# Patient Record
Sex: Female | Born: 1945 | Race: White | Hispanic: No | Marital: Single | Smoking: Never smoker
Health system: Southern US, Community
[De-identification: ages and names within clinical notes are randomized; demographics above are authoritative.]

## PROBLEM LIST (undated history)

## (undated) DIAGNOSIS — G43909 Migraine, unspecified, not intractable, without status migrainosus: Secondary | ICD-10-CM

## (undated) DIAGNOSIS — M5020 Other cervical disc displacement, unspecified cervical region: Secondary | ICD-10-CM

## (undated) DIAGNOSIS — I10 Essential (primary) hypertension: Secondary | ICD-10-CM

## (undated) DIAGNOSIS — I341 Nonrheumatic mitral (valve) prolapse: Secondary | ICD-10-CM

## (undated) HISTORY — PX: APPENDECTOMY: SHX54

## (undated) HISTORY — PX: HAND SURGERY: SHX662

## (undated) HISTORY — DX: Migraine, unspecified, not intractable, without status migrainosus: G43.909

## (undated) HISTORY — DX: Essential (primary) hypertension: I10

## (undated) HISTORY — DX: Nonrheumatic mitral (valve) prolapse: I34.1

## (undated) HISTORY — DX: Other cervical disc displacement, unspecified cervical region: M50.20

---

## 2010-08-15 ENCOUNTER — Ambulatory Visit: Payer: Self-pay | Admitting: Obstetrics and Gynecology

## 2010-09-15 ENCOUNTER — Emergency Department: Payer: Self-pay | Admitting: Emergency Medicine

## 2010-09-16 ENCOUNTER — Emergency Department: Payer: Self-pay | Admitting: Emergency Medicine

## 2011-02-03 ENCOUNTER — Ambulatory Visit: Payer: Self-pay | Admitting: Orthopedic Surgery

## 2011-02-17 ENCOUNTER — Emergency Department: Payer: Self-pay | Admitting: Emergency Medicine

## 2011-02-27 ENCOUNTER — Emergency Department: Payer: Self-pay | Admitting: Emergency Medicine

## 2011-03-10 ENCOUNTER — Emergency Department: Payer: Self-pay | Admitting: Emergency Medicine

## 2011-05-19 ENCOUNTER — Encounter: Payer: Self-pay | Admitting: Orthopedic Surgery

## 2011-06-19 ENCOUNTER — Encounter: Payer: Self-pay | Admitting: Orthopedic Surgery

## 2011-07-20 ENCOUNTER — Encounter: Payer: Self-pay | Admitting: Orthopedic Surgery

## 2011-08-17 ENCOUNTER — Encounter: Payer: Self-pay | Admitting: Orthopedic Surgery

## 2011-09-18 ENCOUNTER — Encounter: Payer: Self-pay | Admitting: Orthopedic Surgery

## 2011-09-20 ENCOUNTER — Ambulatory Visit: Payer: Self-pay | Admitting: Obstetrics and Gynecology

## 2011-09-20 LAB — COMPREHENSIVE METABOLIC PANEL
Albumin: 4.3 g/dL (ref 3.4–5.0)
Alkaline Phosphatase: 57 U/L (ref 50–136)
BUN: 24 mg/dL — ABNORMAL HIGH (ref 7–18)
Chloride: 105 mmol/L (ref 98–107)
Co2: 28 mmol/L (ref 21–32)
Creatinine: 0.8 mg/dL (ref 0.60–1.30)
EGFR (African American): >60
EGFR (Non-African Amer.): >60
Glucose: 87 mg/dL (ref 65–99)
SGPT (ALT): 33 U/L
Sodium: 140 mmol/L (ref 136–145)
Total Protein: 8.2 g/dL (ref 6.4–8.2)

## 2011-09-20 LAB — LIPID PANEL
Cholesterol: 254 mg/dL — ABNORMAL HIGH (ref 0–200)
Ldl Cholesterol, Calc: 171 mg/dL — ABNORMAL HIGH (ref 0–100)
VLDL Cholesterol, Calc: 21 mg/dL (ref 5–40)

## 2011-10-17 ENCOUNTER — Encounter: Payer: Self-pay | Admitting: Orthopedic Surgery

## 2011-12-19 ENCOUNTER — Emergency Department: Payer: Self-pay | Admitting: Emergency Medicine

## 2011-12-19 LAB — COMPREHENSIVE METABOLIC PANEL
Albumin: 2.9 g/dL — ABNORMAL LOW (ref 3.4–5.0)
Alkaline Phosphatase: 494 U/L — ABNORMAL HIGH (ref 50–136)
BUN: 33 mg/dL — ABNORMAL HIGH (ref 7–18)
Bilirubin,Total: 5.2 mg/dL — ABNORMAL HIGH (ref 0.2–1.0)
Chloride: 103 mmol/L (ref 98–107)
Creatinine: 1.92 mg/dL — ABNORMAL HIGH (ref 0.60–1.30)
EGFR (African American): 30 — ABNORMAL LOW
EGFR (Non-African Amer.): 26 — ABNORMAL LOW
Osmolality: 278 (ref 275–301)
SGOT(AST): 323 U/L — ABNORMAL HIGH (ref 15–37)
SGPT (ALT): 206 U/L — ABNORMAL HIGH
Sodium: 135 mmol/L — ABNORMAL LOW (ref 136–145)
Total Protein: 7.2 g/dL (ref 6.4–8.2)

## 2011-12-19 LAB — URINALYSIS, COMPLETE
Glucose,UR: NEGATIVE mg/dL (ref 0–75)
Granular Cast: 23
Ketone: NEGATIVE
Protein: 30
RBC,UR: 1 /HPF (ref 0–5)
Specific Gravity: 1.016 (ref 1.003–1.030)
WBC UR: 8 /HPF (ref 0–5)

## 2011-12-19 LAB — CBC
HCT: 35 % (ref 35.0–47.0)
HGB: 11.8 g/dL — ABNORMAL LOW (ref 12.0–16.0)
MCHC: 33.6 g/dL (ref 32.0–36.0)
MCV: 88 fL (ref 80–100)
RBC: 3.99 10*6/uL (ref 3.80–5.20)
WBC: 9.3 10*3/uL (ref 3.6–11.0)

## 2011-12-19 LAB — CK TOTAL AND CKMB (NOT AT ARMC): CK-MB: 11.2 ng/mL — ABNORMAL HIGH (ref 0.5–3.6)

## 2011-12-19 LAB — TROPONIN I: Troponin-I: <0.02 ng/mL

## 2011-12-23 ENCOUNTER — Inpatient Hospital Stay: Payer: Self-pay | Admitting: Internal Medicine

## 2011-12-23 LAB — COMPREHENSIVE METABOLIC PANEL
Albumin: 2.7 g/dL — ABNORMAL LOW (ref 3.4–5.0)
Alkaline Phosphatase: 628 U/L — ABNORMAL HIGH (ref 50–136)
Anion Gap: 8 (ref 7–16)
BUN: 46 mg/dL — ABNORMAL HIGH (ref 7–18)
Creatinine: 1.85 mg/dL — ABNORMAL HIGH (ref 0.60–1.30)
EGFR (African American): 32 — ABNORMAL LOW
EGFR (Non-African Amer.): 27 — ABNORMAL LOW
Glucose: 95 mg/dL (ref 65–99)
SGOT(AST): 143 U/L — ABNORMAL HIGH (ref 15–37)
SGPT (ALT): 159 U/L — ABNORMAL HIGH
Sodium: 135 mmol/L — ABNORMAL LOW (ref 136–145)
Total Protein: 7.5 g/dL (ref 6.4–8.2)

## 2011-12-23 LAB — CBC
MCHC: 33.9 g/dL (ref 32.0–36.0)
Platelet: 233 10*3/uL (ref 150–440)
RBC: 3.93 10*6/uL (ref 3.80–5.20)
RDW: 15.8 % — ABNORMAL HIGH (ref 11.5–14.5)

## 2011-12-23 LAB — URINALYSIS, COMPLETE
Bacteria: NONE SEEN
Bilirubin,UR: NEGATIVE
Leukocyte Esterase: NEGATIVE
Nitrite: NEGATIVE
Ph: 5 (ref 4.5–8.0)
RBC,UR: <1 /HPF (ref 0–5)
WBC UR: 3 /HPF (ref 0–5)

## 2011-12-23 LAB — PROTIME-INR
INR: 1.1
Prothrombin Time: 14.2 secs (ref 11.5–14.7)

## 2011-12-23 LAB — TROPONIN I: Troponin-I: <0.02 ng/mL

## 2011-12-24 LAB — CBC WITH DIFFERENTIAL/PLATELET
Basophil #: 0.1 10*3/uL (ref 0.0–0.1)
Basophil %: 0.5 %
Eosinophil %: 0.9 %
HGB: 10.3 g/dL — ABNORMAL LOW (ref 12.0–16.0)
Lymphocyte #: 2.8 10*3/uL (ref 1.0–3.6)
Lymphocyte %: 17.8 %
MCH: 28.1 pg (ref 26.0–34.0)
MCV: 84 fL (ref 80–100)
Monocyte #: 2 x10 3/mm — ABNORMAL HIGH (ref 0.2–0.9)
Monocyte %: 12.7 %
Neutrophil %: 68.1 %
Platelet: 282 10*3/uL (ref 150–440)
RBC: 3.67 10*6/uL — ABNORMAL LOW (ref 3.80–5.20)
RDW: 16.2 % — ABNORMAL HIGH (ref 11.5–14.5)
WBC: 15.7 10*3/uL — ABNORMAL HIGH (ref 3.6–11.0)

## 2011-12-24 LAB — COMPREHENSIVE METABOLIC PANEL
Anion Gap: 8 (ref 7–16)
BUN: 30 mg/dL — ABNORMAL HIGH (ref 7–18)
Bilirubin,Total: 1.6 mg/dL — ABNORMAL HIGH (ref 0.2–1.0)
Calcium, Total: 7.7 mg/dL — ABNORMAL LOW (ref 8.5–10.1)
Chloride: 108 mmol/L — ABNORMAL HIGH (ref 98–107)
EGFR (African American): 43 — ABNORMAL LOW
Potassium: 4.6 mmol/L (ref 3.5–5.1)
SGOT(AST): 111 U/L — ABNORMAL HIGH (ref 15–37)
Total Protein: 7.2 g/dL (ref 6.4–8.2)

## 2011-12-24 LAB — ACETAMINOPHEN LEVEL: Acetaminophen: <2 ug/mL

## 2011-12-24 LAB — LIPASE, BLOOD: Lipase: 2752 U/L — ABNORMAL HIGH (ref 73–393)

## 2013-04-29 ENCOUNTER — Encounter: Payer: Self-pay | Admitting: Orthopaedic Surgery

## 2013-05-18 ENCOUNTER — Encounter: Payer: Self-pay | Admitting: Orthopaedic Surgery

## 2013-06-18 ENCOUNTER — Encounter: Payer: Self-pay | Admitting: Orthopaedic Surgery

## 2013-07-19 ENCOUNTER — Encounter: Payer: Self-pay | Admitting: Orthopaedic Surgery

## 2013-07-29 IMAGING — CR DG CHEST 2V
1 series · 2 of 2 positions shown · non-contrast
Comparison: none

REASON FOR EXAM: cough
COMMENTS:

PROCEDURE:     DXR - DXR CHEST PA (OR AP) AND LATERAL  - December 23, 2011  [DATE]
RESULT:     Comparison: None

[Series 2: x chest ap · 0.14mm/px · 2 of 2 slices shown]
[im 1/2]
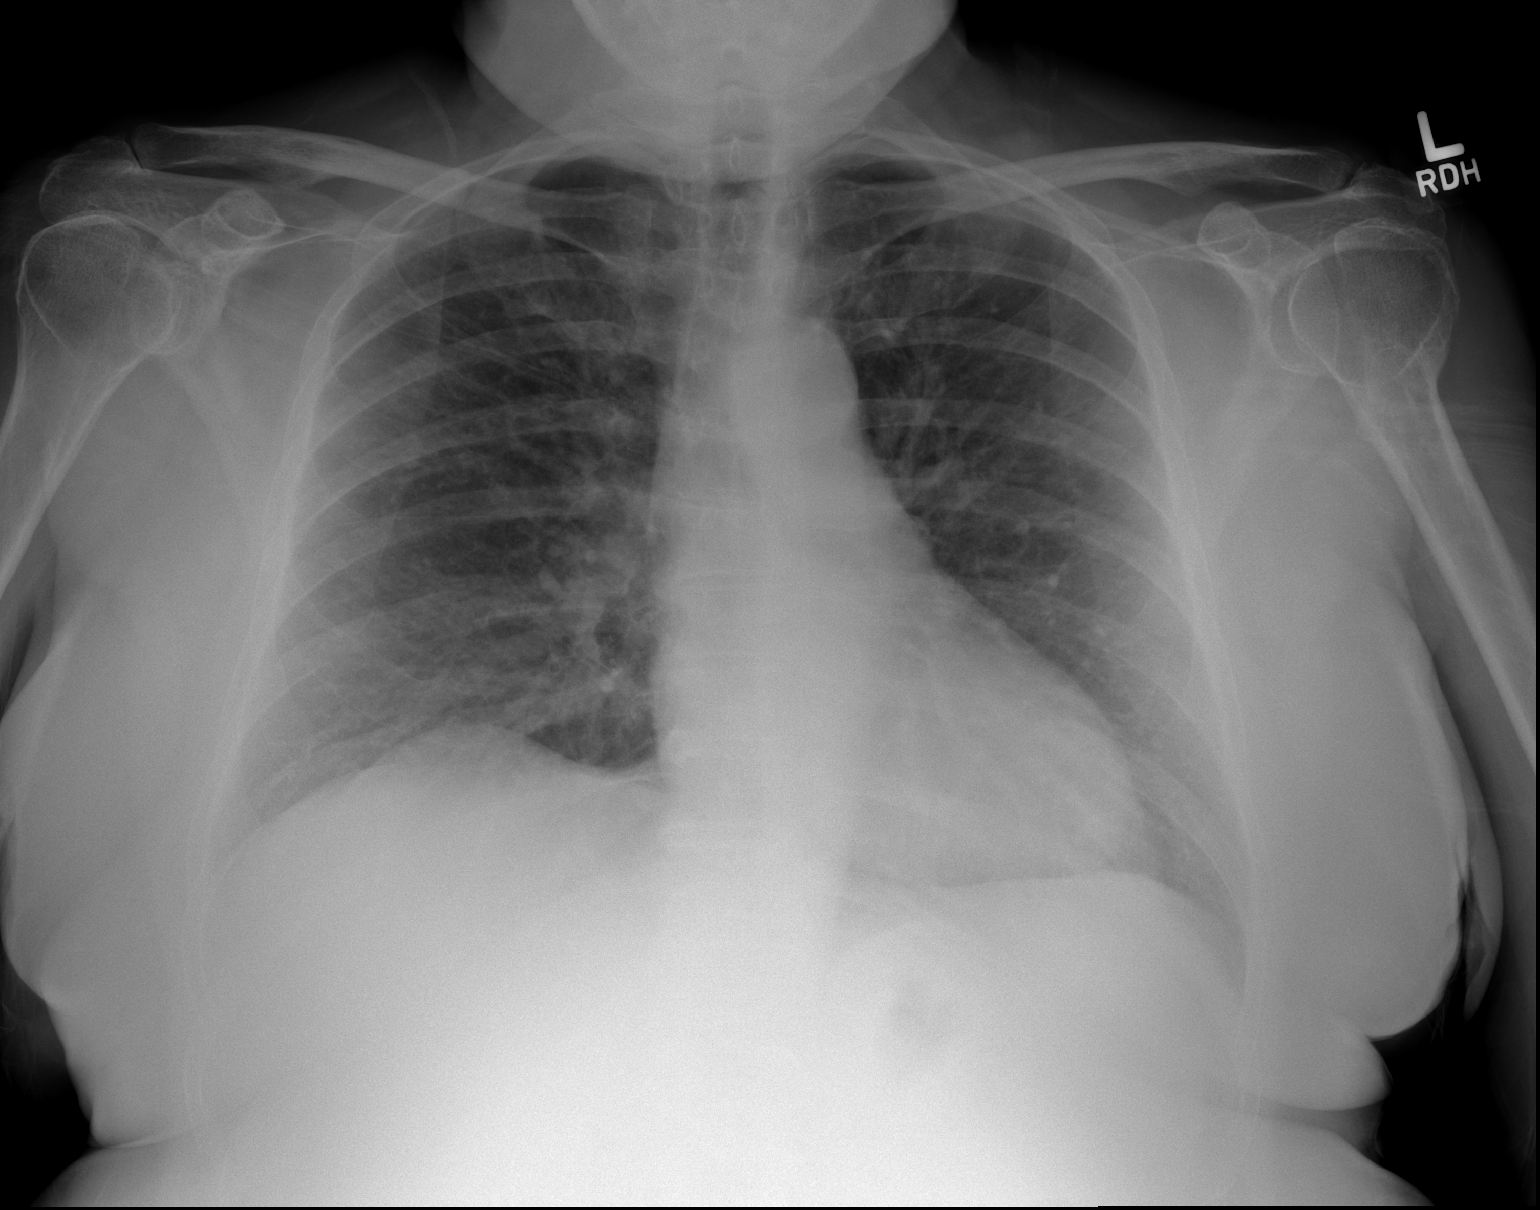
[im 2/2]
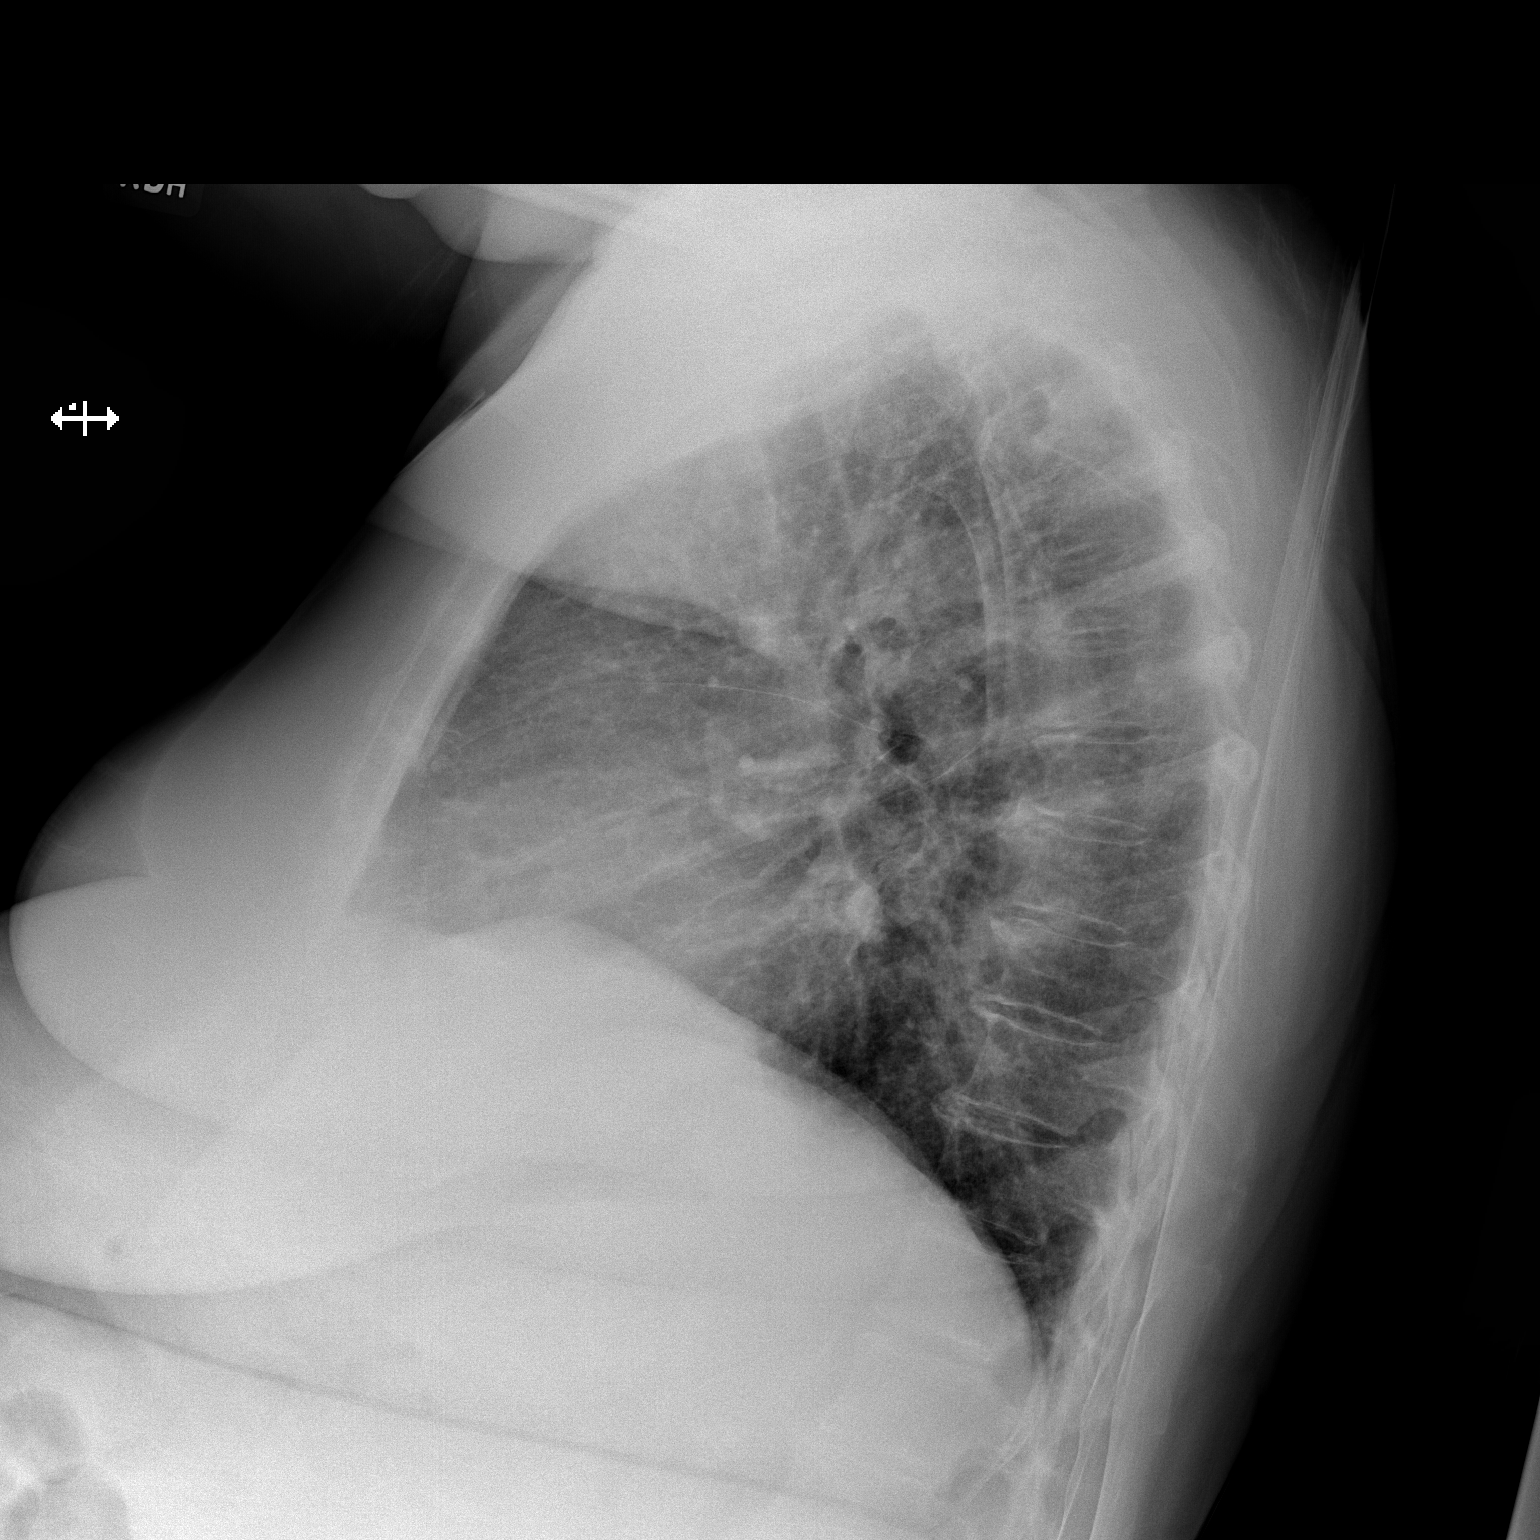

[2 of 2 positions shown; findings below may reference images not displayed]

FINDINGS: PA and lateral chest radiographs are provided.  There is no focal
parenchymal opacity, pleural effusion, or pneumothorax. The heart and
mediastinum are unremarkable.  The osseous structures are unremarkable.
IMPRESSION: No acute disease of the che[REDACTED]

## 2013-07-29 IMAGING — US ABDOMEN ULTRASOUND
1 series · 14 of 25 positions shown · non-contrast
Comparison: none

REASON FOR EXAM: lft elevation
COMMENTS:   May transport without cardiac monitor

PROCEDURE:     US  - US ABDOMEN GENERAL SURVEY  - December 23, 2011  [DATE]
RESULT:     Comparison: None
TECHNIQUE: Multiple gray-scale and color-flow Doppler images of the abdomen
are presented for review.

[Series 1: abdomen ultrasound · 0.31mm/px · 14 of 88 slices shown]
[im 1/88]
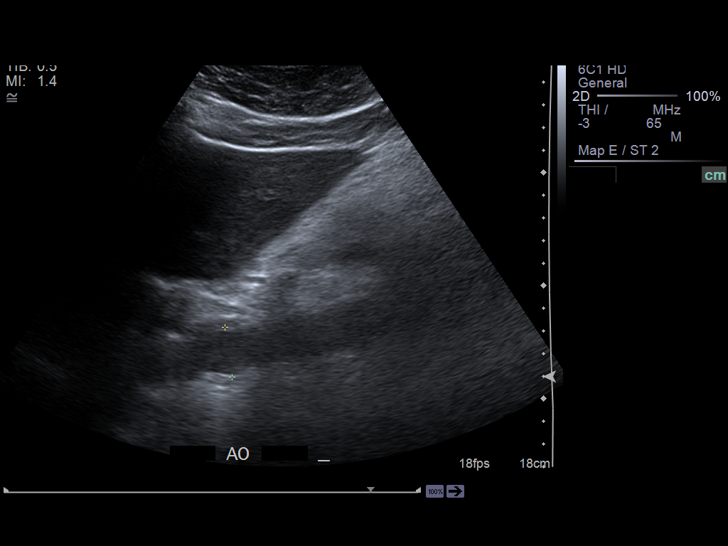
[im 8/88]
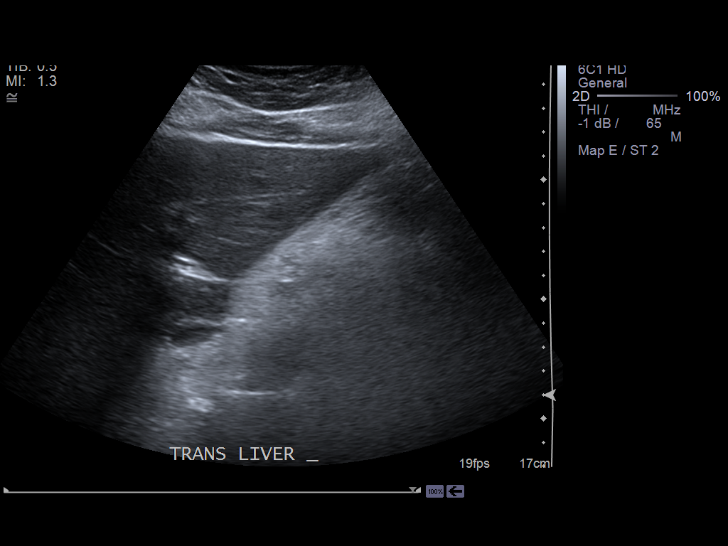
[im 15/88]
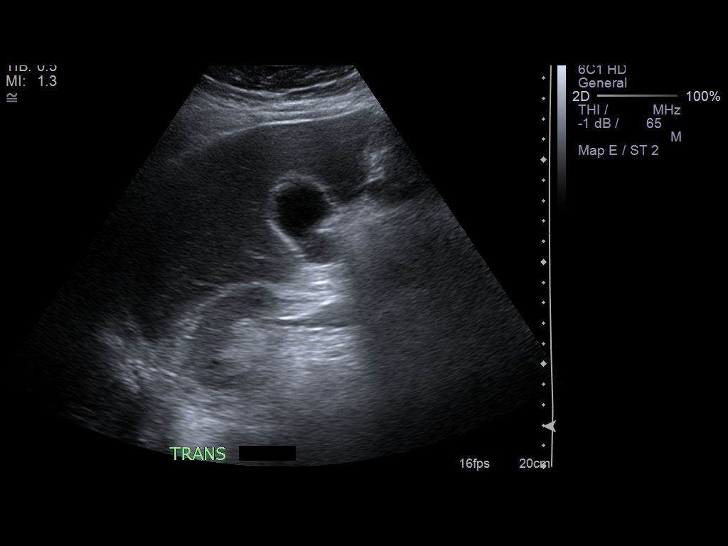
[im 22/88]
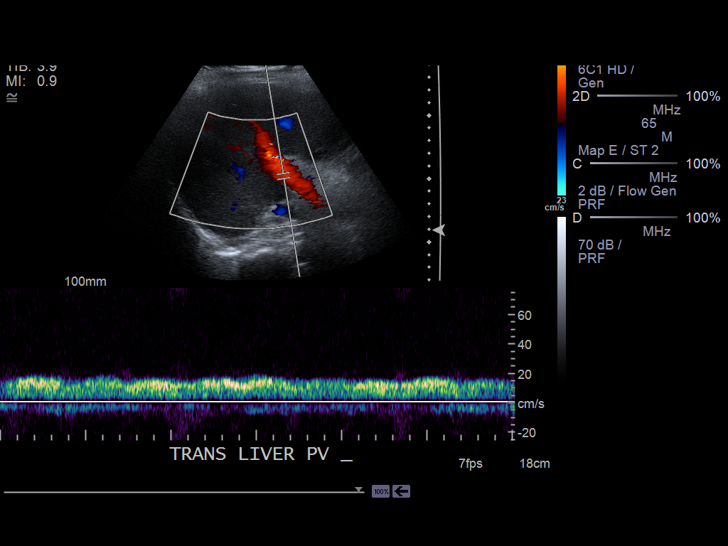
[im 30/88]
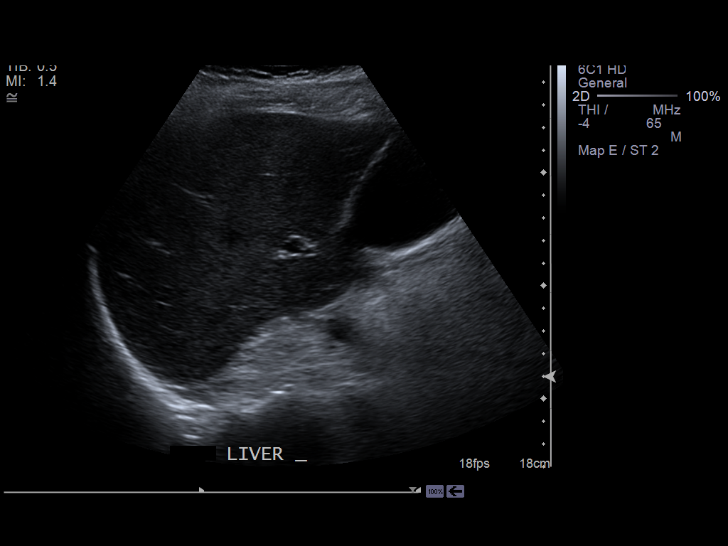
[im 33/88]
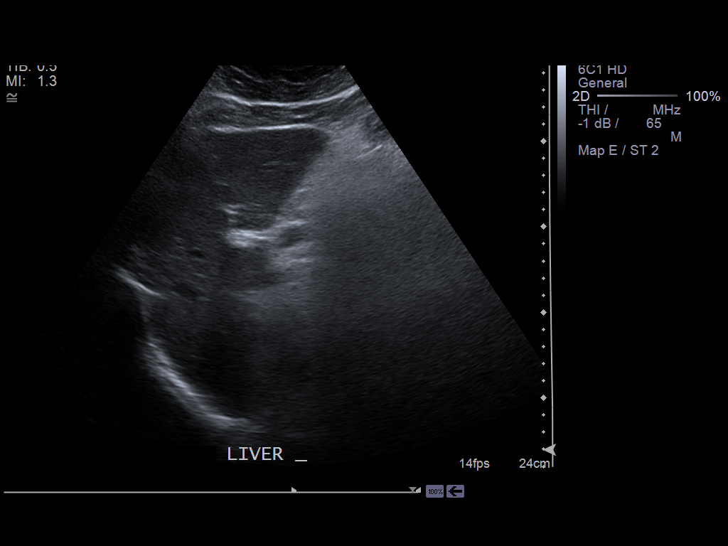
[im 40/88]
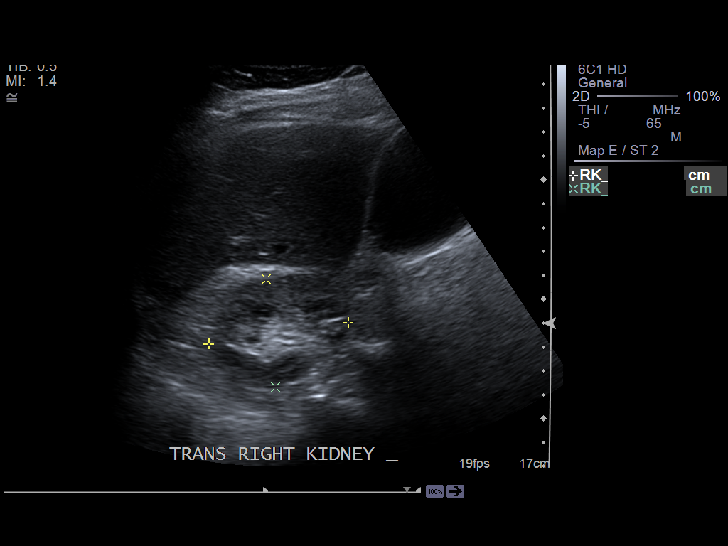
[im 48/88]
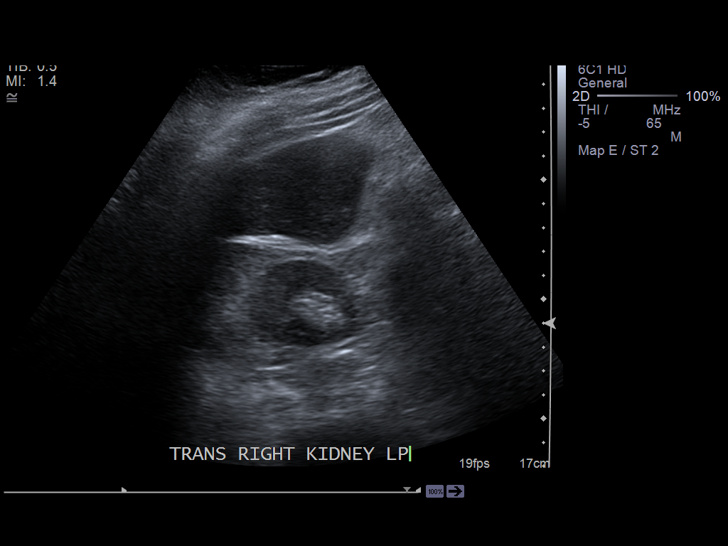
[im 55/88]
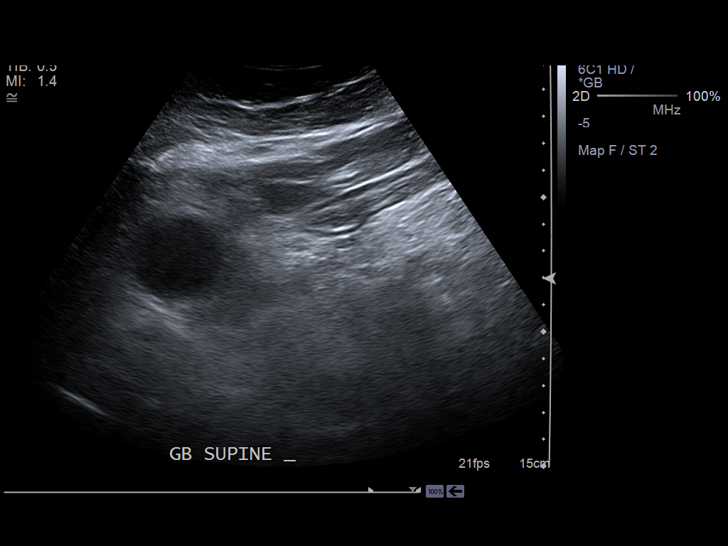
[im 59/88]
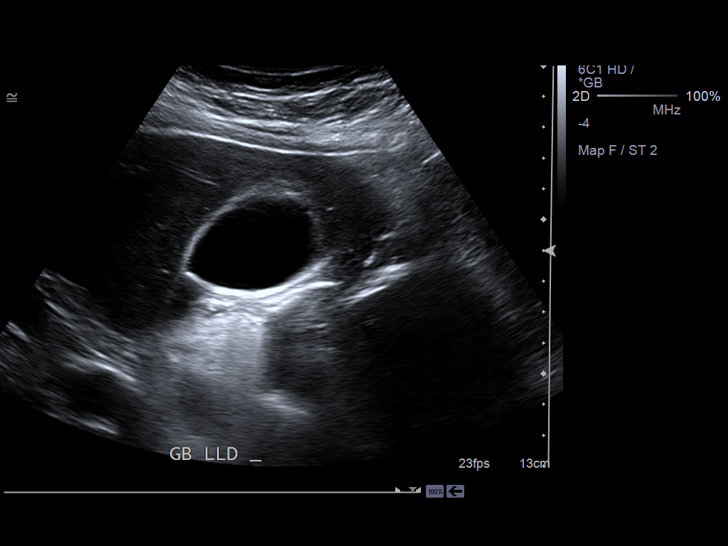
[im 66/88]
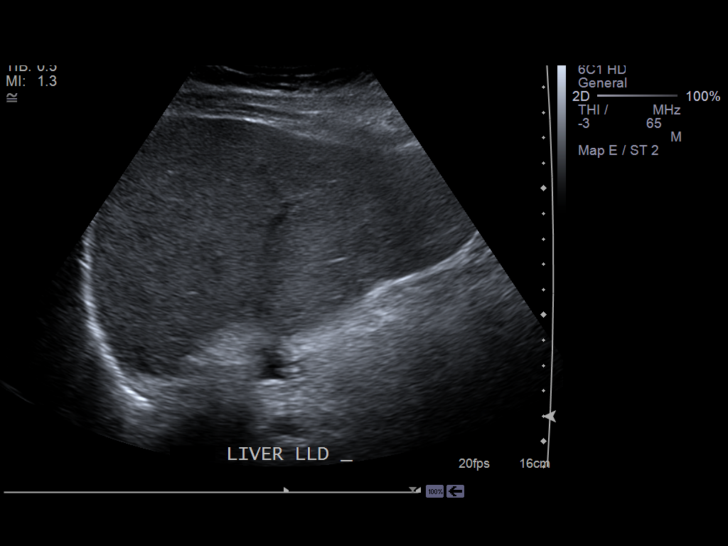
[im 73/88]
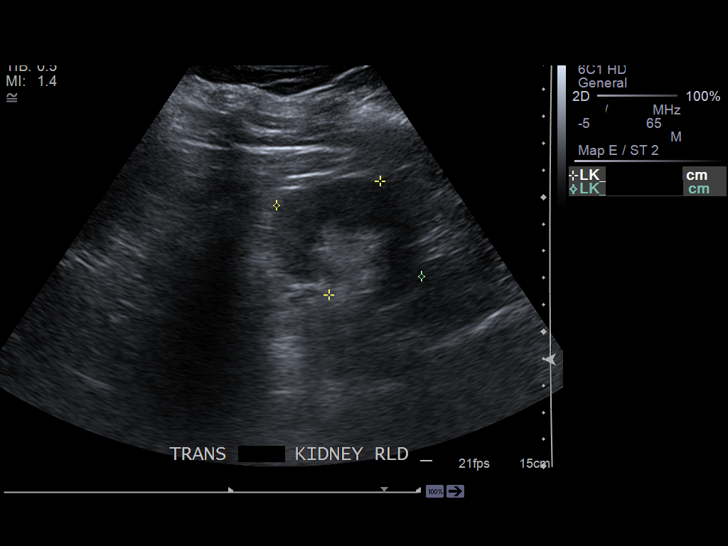
[im 80/88]
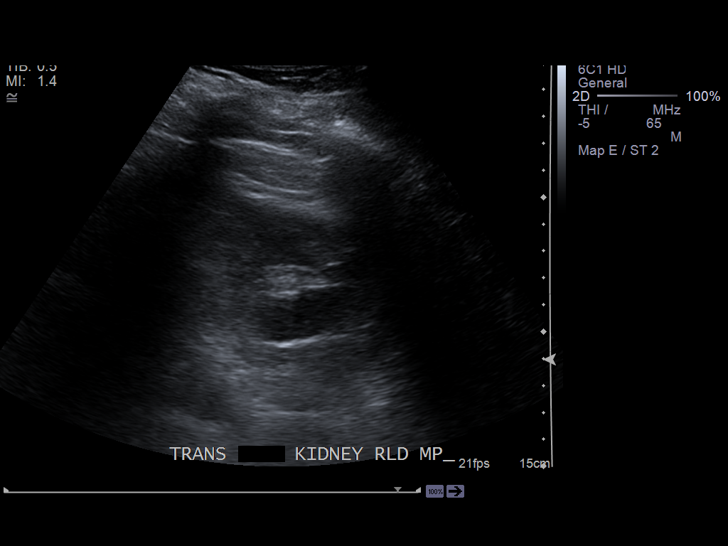
[im 88/88]
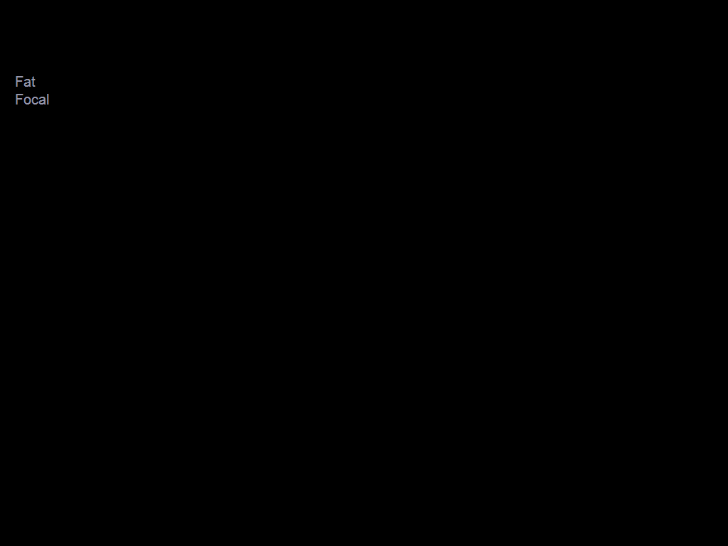

[14 of 25 positions shown; findings below may reference images not displayed]

FINDINGS: Visualized portions of the liver demonstrate normal echogenicity and normal
contours. The liver is without evidence of a focal hepatic lesion.

There is no cholelithiasis or biliary sludge. There is no intra- or
extrahepatic biliary ductal dilatation. The common duct measures 3.7 mm in
maximal diameter. There is no gallbladder wall thickening, pericholecystic
fluid, or sonographic Murphy's sign.

The visualized portion of the pancreas is normal in echogenicity. The spleen
is unremarkable. Bilateral kidneys are normal in echogenicity and size. The
right kidney measures 11.2 x 5.9 x 4.5 cm. The left kidney measures 11.6 x
4.6 x 6 cm. There are no renal calculi or hydronephrosis. The abdominal
aorta and IVC are unremarkable.
IMPRESSION: 1. No cholelithiasis or sonographic evidence of acute cholecystitis.

[REDACTED]

## 2013-07-29 IMAGING — CT CT HEAD WITHOUT CONTRAST
2 series · 16 of 30 positions shown, 20 images · non-contrast
Comparison: none

REASON FOR EXAM: ha confusion
COMMENTS:

PROCEDURE:     CT  - CT HEAD WITHOUT CONTRAST  - December 23, 2011  [DATE]
RESULT:     Comparison:  None
TECHNIQUE: Multiple axial images from the foramen magnum to the vertex were
obtained without IV contrast.

[Series 2: without · axial · non-contrast · 0.39mm/px · z∈[-168,-48]mm · 13 of 30 slices shown, 17 images]
[im 3/30  brain]
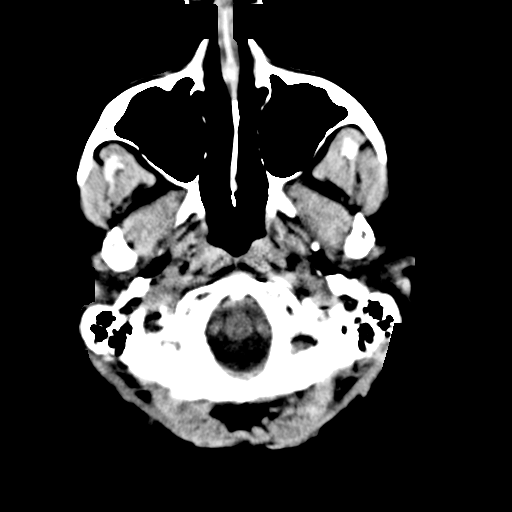
[im 3/30  bone]
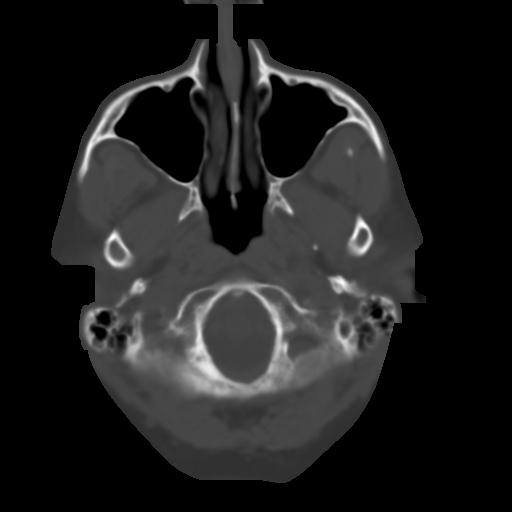
[im 5/30  brain]
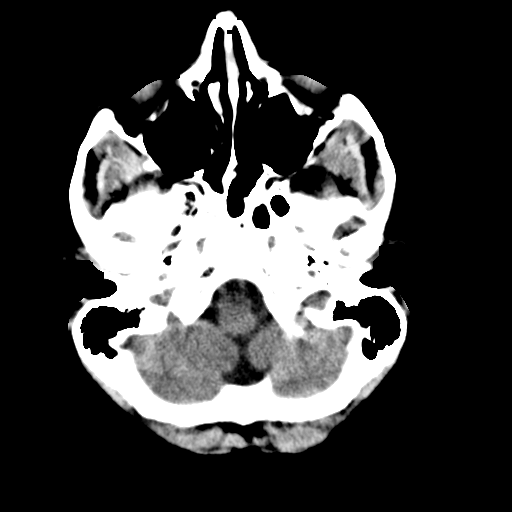
[im 7/30  brain]
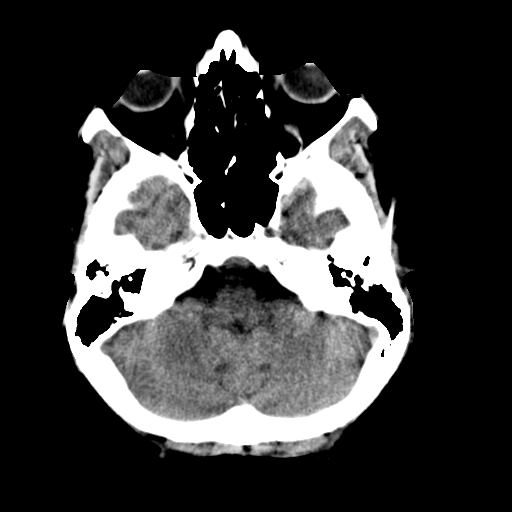
[im 9/30  brain]
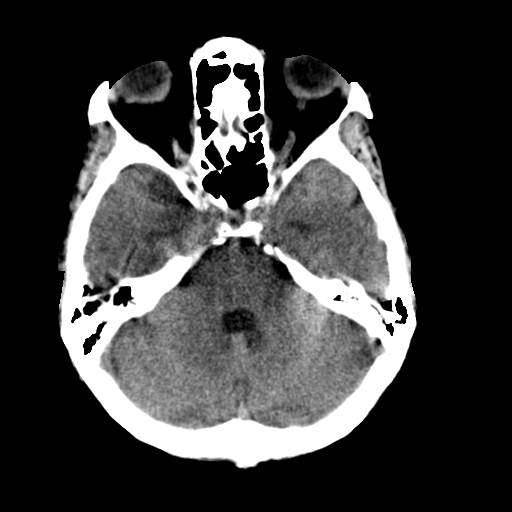
[im 11/30  brain]
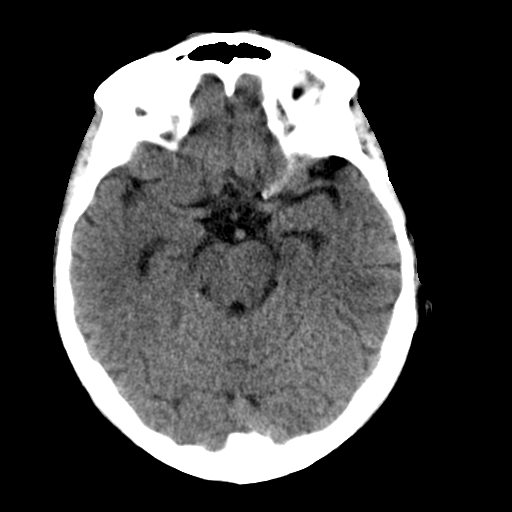
[im 11/30  bone]
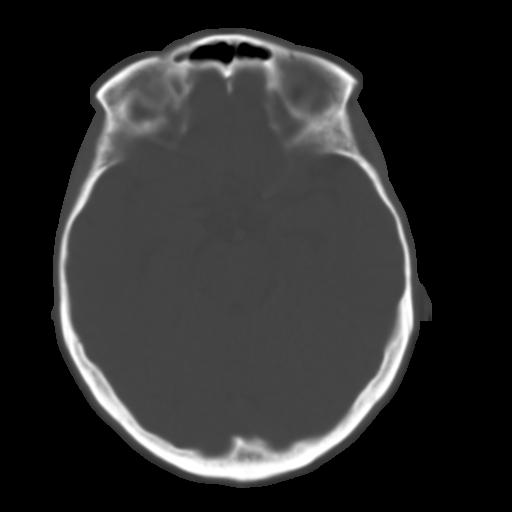
[im 13/30  brain]
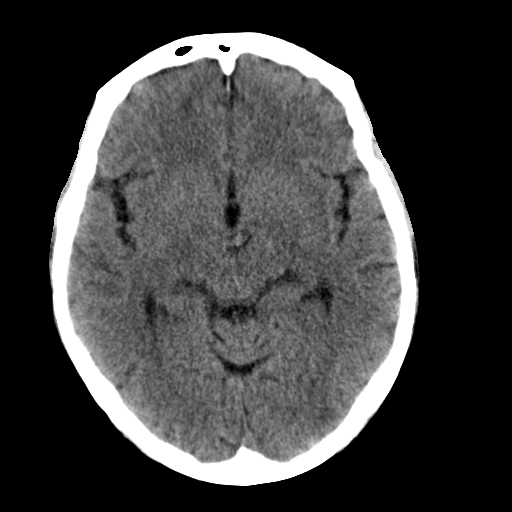
[im 15/30  brain]
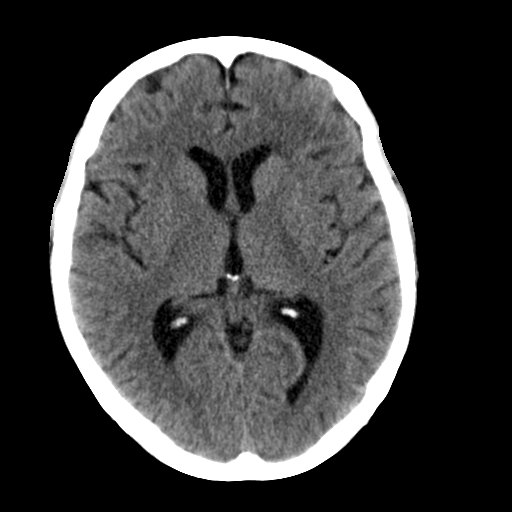
[im 17/30  brain]
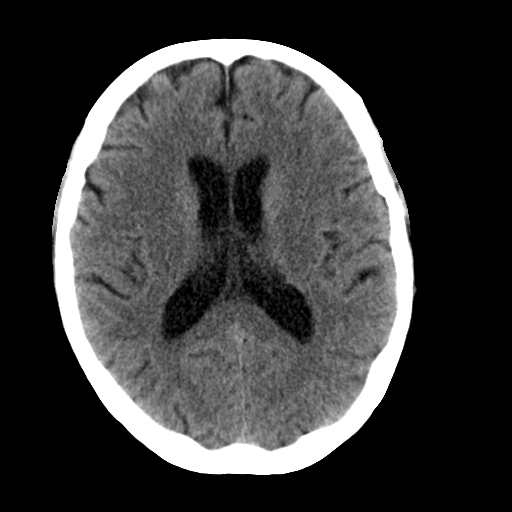
[im 19/30  brain]
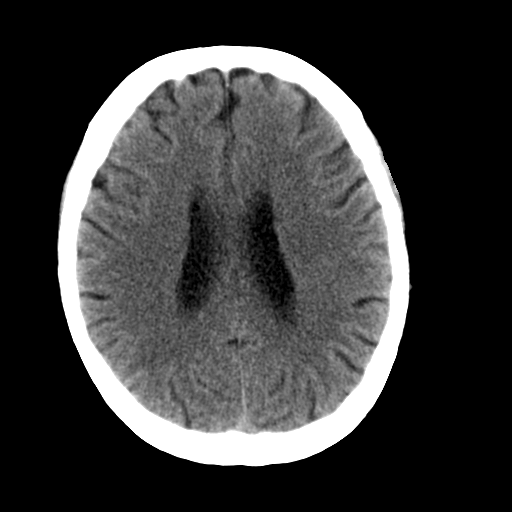
[im 19/30  bone]
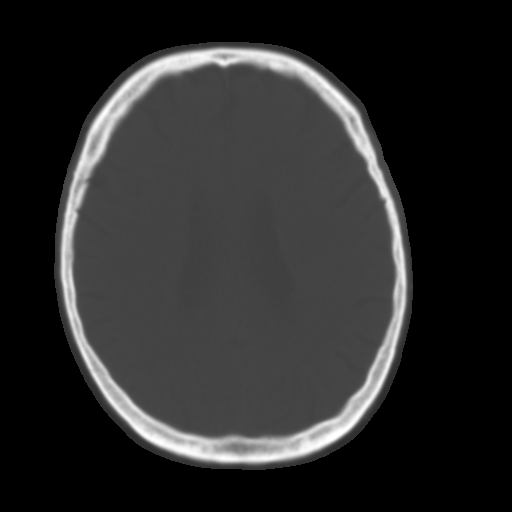
[im 21/30  brain]
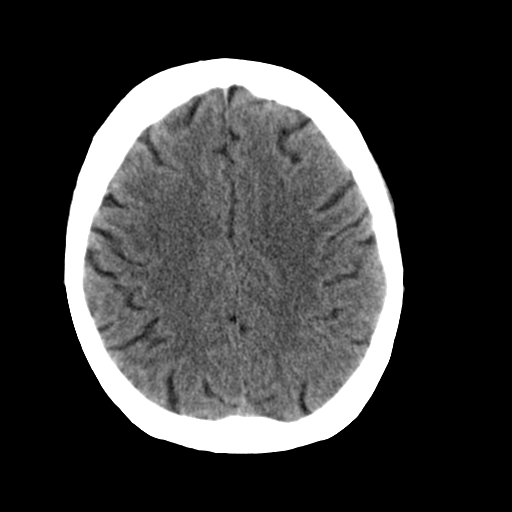
[im 23/30  brain]
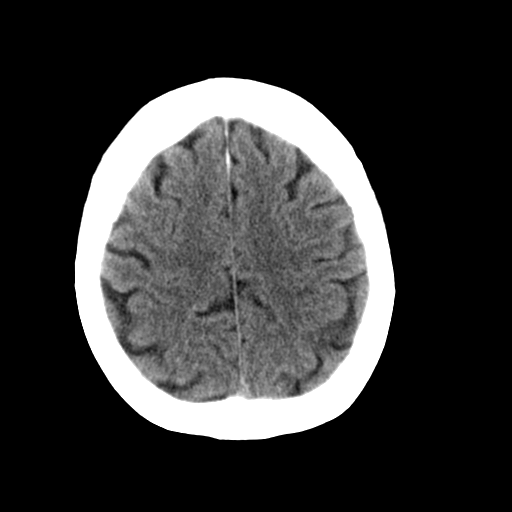
[im 25/30  brain]
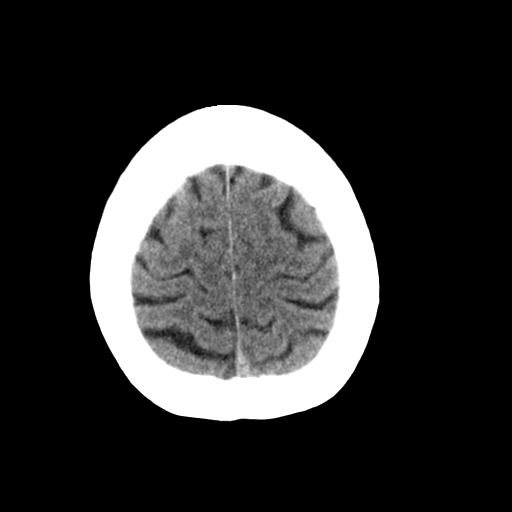
[im 27/30  brain]
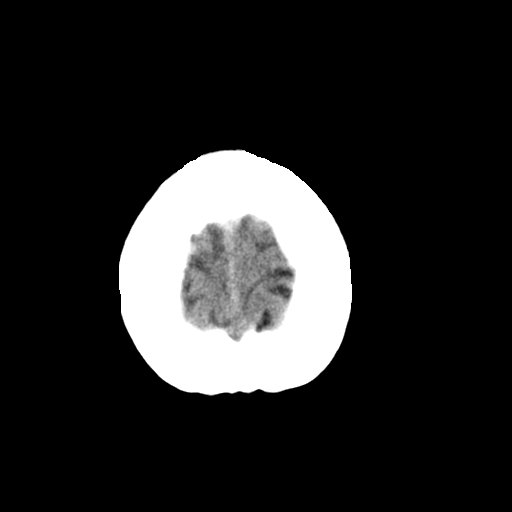
[im 27/30  bone]
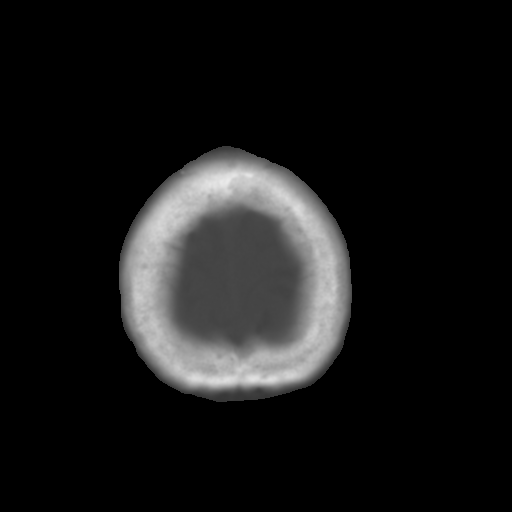

[Series 3: bone · axial · 0.39mm/px · z∈[-168,-128]mm · 3 of 30 slices shown]
[im 3/30  bone]
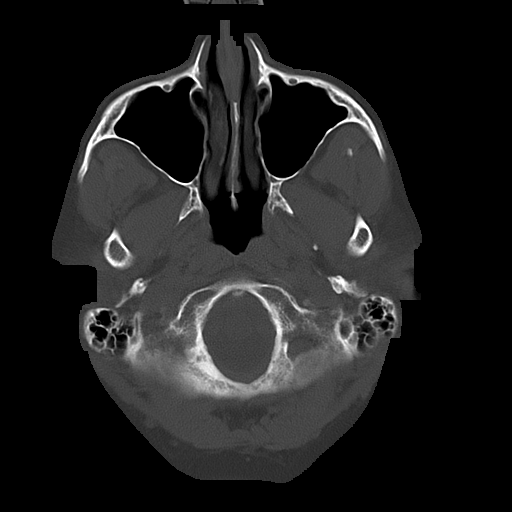
[im 7/30  bone]
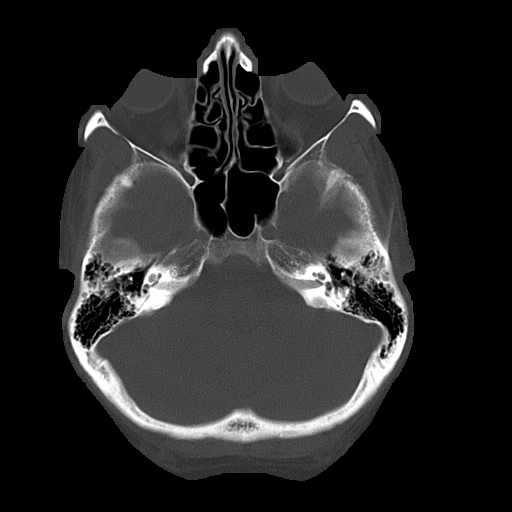
[im 11/30  bone]
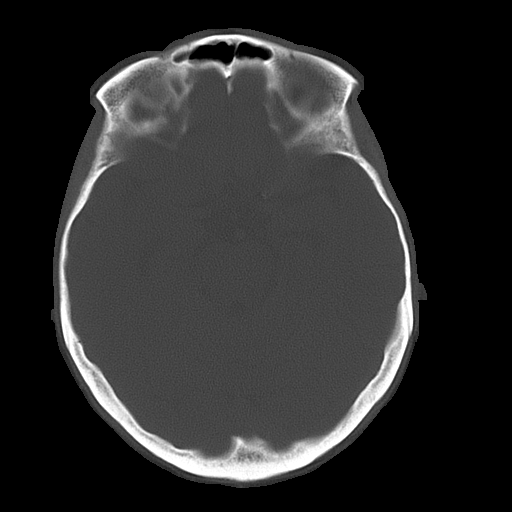

[16 of 30 positions shown; findings below may reference images not displayed]

FINDINGS: There is no evidence of mass effect, midline shift, or extra-axial fluid
collections.  There is no evidence of a space-occupying lesion or
intracranial hemorrhage. There is no evidence of a cortical-based area of
acute infarction.

The ventricles and sulci are appropriate for the patient's age. The basal
cisterns are patent.

Visualized portions of the orbits are unremarkable. The visualized portions
of the paranasal sinuses and mastoid air cells are unremarkable.

The osseous structures are unremarkable.
IMPRESSION: No acute intracranial process.

[REDACTED]

## 2013-08-16 ENCOUNTER — Encounter: Payer: Self-pay | Admitting: Orthopaedic Surgery

## 2014-10-10 NOTE — H&P (Signed)
PATIENT NAME:  Karen Frank, Karen Frank MR#:  [id number] DATE OF BIRTH:  Dec 02, 1945  DATE OF ADMISSION:  12/23/2011  PRIMARY CARE PHYSICIAN: Unknown. She has no local doctor.   CHIEF COMPLAINT: Nausea, decreased oral intake, visual problems, dehydration, and memory problem.   HISTORY OF PRESENT ILLNESS: Ms. 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 She was in her usual state of health until Saturday a week ago when she sprayed herself with DEET using over-the-counter product called Off DEET Wood Dry. The patient reports that contains DEET and she sprayed it all over her body. This is the first time to do this trying to repel insects with her intention to go and feed the deer corn. Following that the patient reported that for a few days she had skin rash, visual problems, foggy like vision. She has constipation that she had to use Dulcolax and Fleet enemas and usually she does not have constipation, problem with swallowing associated with nausea and she was unable to hydrate herself. She developed dry mucous membranes, fissuring of lips and also some nosebleed as well. Had memory difficulty and states that she lost two days of her life that she does not recall what happened. Apparently she appeared here in the Emergency Department on July 3rd, however, she left before being seen and evaluated by the Emergency Department physician. The patient came here again with the picture of pancreatitis, hepatitis, severe dehydration, and evidence of renal failure. The patient was admitted for further evaluation and monitoring with clinical picture of drug toxicity with DEET.   REVIEW OF SYSTEMS: CONSTITUTIONAL: Denies any fever. No chills. Reports mild fatigue. No night sweats. EYES: She reports blurring of vision, foggy like visual disturbances but no double vision. ENT: No hearing impairment but she has mild sore  throat, dry mucous membranes, dysphagia. She also reports some dizziness. CARDIOVASCULAR: No chest pain. No shortness of breath. No edema. No syncope. RESPIRATORY: No cough. No shortness of breath. No chest pain. GASTROINTESTINAL: Reports nausea but no vomiting. No abdominal pain but she reports severe constipation. No melena or hematochezia. GENITOURINARY: No polyuria or polydipsia. Denies urinary retention but she indicates that her urine color changed to brown-like color. MUSCULOSKELETAL: No joint pain or swelling other than her chronic back pain. No muscular pain or swelling. INTEGUMENTARY: She has skin rash on her extremities. This had subsided but she had residual rash on her legs. No ulcers. NEUROLOGY: No focal weakness. No seizure activity. No headache. No ataxia but she had some dizziness. PSYCHIATRY: No anxiety. No depression. ENDOCRINE: No polyuria or polydipsia. No heat or cold intolerance.   PAST MEDICAL HISTORY: Chronic back pain for which she had physical therapy. No prior history of pancreatitis. No hypertension or diabetes.   PAST SURGICAL HISTORY:  1. Right hand surgery after cat bite with localized infection.  2. History of ovarian cyst surgery. 3. Appendectomy.   FAMILY HISTORY: Her mother died from complication of stroke. She has a brother who had diabetes and hypertension. Her father died at age of 26 from complications of coronary artery disease.   SOCIAL HISTORY: She is single, lives at home alone. She has one daughter who lives in [place]. The patient's husband died many years ago in [place].   ADMISSION MEDICATIONS:  1. Vicodin p.r.n. for her back pain. 2. Naprosyn. 3. Sometimes Percocet.   ALLERGIES: Morphine causes palpitations. She also reports IVP dye but, in fact, she never  tried or treated with intravenous dye but she is concerned because there are many family members who have allergies to IVP dye including both of her parents.   SOCIAL HABITS: Nonsmoker.  There is remote history of smoking in the past. No alcoholism. No drug abuse.   PHYSICAL EXAMINATION:   VITAL SIGNS: Blood pressure 140/69, respiratory rate 18, pulse 97, temperature 94.9, oxygen saturation 95%.   GENERAL APPEARANCE: Elderly lady laying in bed in no acute distress.   HEAD: No pallor. No icterus. No cyanosis.   EARS, NOSE, AND THROAT: Hearing was normal. Nasal mucosa, lips, and tongue were remarkable for dry mucous membranes and fissuring of the corners of her mouth and lips. There are superficial yellowish ulcerations at the soft palate. These are tiny lesions.   EYES: Normal iris and conjunctivae. Pupils are about 5 mm, sluggishly reactive to light.   NECK: Supple. Trachea at midline. No thyromegaly. No cervical lymphadenopathy. No masses.   HEART: Normal S1, S2. No S3, S4. No murmur. No gallop. No carotid bruits.   RESPIRATORY: Normal breathing pattern without use of accessory muscles. No rales. No wheezing.   ABDOMEN: Soft without tenderness. No hepatosplenomegaly. No masses. No hernias.   SKIN: No ulcers. There is mild macular rash apparent on lower extremities only especially below the knee.   MUSCULOSKELETAL: No joint swelling. No clubbing.   NEUROLOGIC: Cranial nerves II through XII are intact. No focal motor deficit.   PSYCHIATRIC: The patient is alert and oriented x3. Mood and affect were normal.   LABORATORY, DIAGNOSTIC, AND RADIOLOGICAL DATA: EKG showed normal sinus rhythm at rate of 93 per minute, poor progression of R waves in the anterior chest leads, otherwise unremarkable EKG.  Ultrasound of the abdomen was unremarkable without evidence of any cholelithiasis or other abnormalities.   CAT scan of the abdomen was remarkable for peripancreatic haziness and small amount of fluid consistent with acute pancreatitis. The rest of the CT scan was unremarkable. The CAT scan was done without contrast.   Glucose 95, BUN 46, creatinine 1.8, sodium 135,  potassium 4.7, estimated GFR 27, calcium 8.3, albumin 2.7. Lipase 4491. Bilirubin total was elevated at 1.8. In fact, on July 3rd her bilirubin was 5.2. Her alkaline phosphatase is elevated at 628, AST 143, ALT 159. Troponin less than 0.02. CBC showed white count of 17,000, hemoglobin 11, hematocrit was 33, platelet count 233. Prothrombin time 14. INR 1.1. Urinalysis was unremarkable.   ASSESSMENT:  1. Drug toxicity with DEET.  2. Acute pancreatitis likely secondary to DEET toxicity or drug toxicity.  3. Nonspecific hepatitis secondary to the drug toxicity. 4. Acute renal failure secondary to prerenal ischemia from severe dehydration.  5. Leukocytosis.   PLAN:  1. The patient will be admitted to the medical floor.  2. Aggressive IV hydration.  3. Repeat BMP, liver function tests, and CBC to monitor her renal function and follow-up on her liver toxicity picture. Will also repeat CBC to monitor her leukocytosis.  4. Follow-up on her lipase to ensure improvement.  5. Meanwhile, in addition to the aggressive IV hydration will place the patient on full liquids and advance as tolerated.  6. Zofran p.r.n. for nausea.   7. Will monitor the patient's response.   The patient reports that she does not have a LIVING WILL nor has she appointed anybody to have the power-of-attorney. She does not specify reasons.   TIME SPENT EVALUATING THIS PATIENT: More than 55 minutes.   ____________________________ Carney Corners. Rudene Re, MD amd:drc  D: 12/23/2011 22:56:12 ET T: 12/24/2011 08:50:50 ET JOB#: [id number]  cc: Carney CornersAmir M. Rudene Rearwish, MD, <Dictator> Zollie ScaleAMIR M Macaela Presas MD ELECTRONICALLY SIGNED 12/25/2011 4:46

## 2014-10-10 NOTE — Discharge Summary (Signed)
PATIENT NAME:  Karen Frank, Karen Frank MR#:  [id number] DATE OF BIRTH:  02/12/1946  DATE OF ADMISSION:  12/23/2011 DATE OF DISCHARGE:  12/24/2011  ADMISSION DIAGNOSES:  1. Acute pancreatitis.  2. Acute hepatitis.   DISCHARGE DIAGNOSES: 1. Acute pancreatitis.  2. Acute hepatitis.  3. Possible DEET toxicity.  4. Renal insufficiency, improved.   LABORATORY, DIAGNOSTIC AND RADIOLOGICAL DATA: Discharge labs: Sodium 139, potassium 4.6, chloride 108, bicarbonate 23, BUN 30, creatinine 1.44, glucose 78, lipase 2752, calcium 7.7, total protein 7.2, albumin 2.5, bilirubin 1.6, alkaline phosphatase 594, AST 111, ALT 128, white blood cells 15.7, hemoglobin 10.3, hematocrit 31, platelets 282. Urinalysis shows 30 protein and 1+ blood. CT of the abdomen shows acute pancreatitis. Abdominal ultrasound showed no evidence of gallstones.   Chest x-ray shows no acute cardiopulmonary disease. CT of the head shows no acute intracranial hemorrhage or cerebrovascular accident.   HOSPITAL COURSE: 69 year old female who presented on 07/03 to the ER with skin rash, nausea, unable to hydrate herself but left before M.D. saw her. She presented again with skin rash, nausea, unable to hydrate, dry mucous membranes, severe constipation and memory difficulties. She was found to have acute pancreatitis with elevated lipase of over 4000.  1. Acute pancreatitis of unclear etiology. She is not an alcohol drinker. Her abdominal ultrasound did not show any evidence of gallstones. She has not taking any medications. I was searching for possible DEET toxicity and pancreatitis but I do not see anything obvious in the literature about this. Her CT scan was consistent with acute pancreatitis. She is currently n.p.o. with IV fluids.  2. Elevated liver function tests with a picture of hepatitis. I have ordered hepatitis panel and Tylenol level as the patient was on Percocet two weeks prior to this event.  3. Acute renal insufficiency from  dehydration, improved with IV fluids.  4. Possible drug toxicity.  5. Patient is not on any medications.  6. [facility]. Accepting physician Dr. Derek Moundlhabano.   TIME SPENT: Approximately 35 minutes.   ____________________________ Karen ContesSital P. Juliene PinaMody, MD spm:cms D: 12/24/2011 13:07:07 ET T: 12/24/2011 13:26:02 ET JOB#: [id number]  cc: Karen Merkle P. Juliene PinaMody, MD, <Dictator> Karen ContesSITAL P Blayton Huttner MD ELECTRONICALLY SIGNED 12/25/2011 13:34

## 2021-10-27 ENCOUNTER — Encounter: Payer: Self-pay | Admitting: Emergency Medicine

## 2021-10-27 ENCOUNTER — Other Ambulatory Visit: Payer: Self-pay

## 2021-10-27 ENCOUNTER — Emergency Department
Admission: EM | Admit: 2021-10-27 | Discharge: 2021-10-27 | Discharge status: Against Medical Advice | Payer: Medicare Other | Attending: Emergency Medicine | Admitting: Emergency Medicine

## 2021-10-27 DIAGNOSIS — R350 Frequency of micturition: Secondary | ICD-10-CM | POA: Insufficient documentation

## 2021-10-27 DIAGNOSIS — R262 Difficulty in walking, not elsewhere classified: Secondary | ICD-10-CM | POA: Diagnosis not present

## 2021-10-27 DIAGNOSIS — Z5321 Procedure and treatment not carried out due to patient leaving prior to being seen by health care provider: Secondary | ICD-10-CM | POA: Insufficient documentation

## 2021-10-27 LAB — BASIC METABOLIC PANEL
Anion gap: 12 (ref 5–15)
BUN: 16 mg/dL (ref 8–23)
CO2: 23 mmol/L (ref 22–32)
Calcium: 9 mg/dL (ref 8.9–10.3)
Chloride: 105 mmol/L (ref 98–111)
Creatinine, Ser: 1.29 mg/dL — ABNORMAL HIGH (ref 0.44–1.00)
GFR, Estimated: 42 mL/min — ABNORMAL LOW (ref >60)
Glucose, Bld: 112 mg/dL — ABNORMAL HIGH (ref 70–99)
Potassium: 3 mmol/L — ABNORMAL LOW (ref 3.5–5.1)
Sodium: 140 mmol/L (ref 135–145)

## 2021-10-27 LAB — CBC
HCT: 40.6 % (ref 36.0–46.0)
Hemoglobin: 13.1 g/dL (ref 12.0–15.0)
MCH: 27.8 pg (ref 26.0–34.0)
MCHC: 32.3 g/dL (ref 30.0–36.0)
MCV: 86.2 fL (ref 80.0–100.0)
Platelets: 323 10*3/uL (ref 150–400)
RBC: 4.71 MIL/uL (ref 3.87–5.11)
RDW: 14.8 % (ref 11.5–15.5)
WBC: 6.3 10*3/uL (ref 4.0–10.5)
nRBC: 0 % (ref 0.0–0.2)

## 2021-10-27 NOTE — ED Triage Notes (Signed)
Pt arrived to ED from [facility] with c/o being unable to walk. Pt denies pain or numbness with ambulation but reports that when she walks with her walker it takes her a long time. Pt has been seen by a neurologist for the same at [facility] and states she has been going to physical therapy with no improvement. Pt continues to complain of continued urinary frequency.  ?

## 2021-10-27 NOTE — ED Notes (Signed)
Pt is been seem ambulatory in waiting room. Pt walked to the first nurse station with walker requesting to go home at this moment. Pt stated, " I have been here for 3 hours and I haven't seen a doctor yet. " This EDT informed pt that she will inform Janeece Riggers first nurse.  ?

## 2021-11-14 ENCOUNTER — Other Ambulatory Visit: Payer: Self-pay | Admitting: Neurology

## 2021-11-14 DIAGNOSIS — R2689 Other abnormalities of gait and mobility: Secondary | ICD-10-CM

## 2021-11-27 ENCOUNTER — Other Ambulatory Visit: Payer: Self-pay

## 2021-11-27 ENCOUNTER — Ambulatory Visit: Payer: Self-pay

## 2021-11-28 ENCOUNTER — Ambulatory Visit: Payer: Medicare Other | Attending: Internal Medicine

## 2021-11-28 DIAGNOSIS — R293 Abnormal posture: Secondary | ICD-10-CM | POA: Insufficient documentation

## 2021-11-28 DIAGNOSIS — R2689 Other abnormalities of gait and mobility: Secondary | ICD-10-CM | POA: Insufficient documentation

## 2021-11-28 NOTE — Therapy (Signed)
OUTPATIENT PHYSICAL THERAPY PHYSICAL THERAPY EVALUATION    OUTPATIENT PHYSICAL THERAPY FEMALE PELVIC EVALUATION   Patient Name: Karen Frank MRN: [id number] DOB:1946-05-11, 76 y.o., female Today's Date: 11/28/2021   PT End of Session - 11/28/21 1501     Visit Number 1    Number of Visits 12    Date for PT Re-Evaluation 02/20/22    Authorization Type IE: 11/28/21    PT Start Time 1505    PT Stop Time 1550    PT Time Calculation (min) 45 min    Equipment Utilized During Treatment Gait belt    Activity Tolerance Patient tolerated treatment well             History reviewed. No pertinent past medical history. History reviewed. No pertinent surgical history. There are no problems to display for this patient.   PCP: Jordan Likes, MD  REFERRING PROVIDER: Vladimir Crofts, MD  REFERRING DIAG: R26.89 (ICD-10-CM) - Imbalance  THERAPY DIAG:  Impairment of balance  Abnormal posture  Other abnormalities of gait and mobility  Rationale for Evaluation and Treatment Rehabilitation  ONSET DATE: About a year ago    SUBJECTIVE:                                                                                                                                                                                       PRECAUTIONS: None  WEIGHT BEARING RESTRICTIONS: No  FALLS:  Has patient fallen in last 6 months? No  OCCUPATION/SOCIAL ACTIVITIES: walking outside, activities around the home  PLOF: Independent with gait  PERTINENT HISTORY/CHART REVIEW: Internal medicine appointment 12/11/21 and neurology appointment 03/13/22   CHIEF COMPLAINT: Patient states that she abruptly lost the ability to walk last Nov-Dec 2022. Upon further explanation, Pt felt weak in her legs and began to use a RW, mobilizing slowly but denying any pain. Patient denies numbness/tingling in the lower extremities. Pt began PT earlier this year and therapy helped and she began to walk better. She felt  better enough to not use the RW or SPC while ambulating in the home. Then all of a sudden, Pt went to physical therapy and couldn't walk again (a few months ago). Pt went to the ED 11/07/21 and waited for 4 hours without being seen. According to ED notes due to "rapid change for confusion and gait." Pt denies pain in sitting or walking. In the home Pt uses mainly the [facility] but occasionally uses the RW, but it is old and she would like a new one. Pt states sometimes she doesn't use anything at all but notices she walks very slowly. Patient has a carpeted home with no STE and  is one-level. Patient has an appointment with a vision specialist tomorrow due to visual changes as she feels like "she's losing her vision."    PAIN:  Are you having pain? No  LIVING ENVIRONMENT: Lives with: lives alone Lives in: House/apartment Stairs: No Has following equipment at home: Single point cane   PATIENT GOALS: To be able to walk further and more confidently without risking a fall    UROLOGICAL HISTORY Pt has no concerns at this time   GASTROINTESTINAL HISTORY Pt has no concerns at this time   SEXUAL HISTORY/FUNCTION Pt has no concerns    GYNECOLOGICAL HISTORY Hysterectomy: yes/no, Vaginal/Abdominal Pelvic Organ Prolapse:  Pain with exam: yes no Heaviness/pressure: yes no   OBJECTIVE:   DIAGNOSTIC TESTING/IMPRESSIONS:  IMPRESSION: (06/22/21) 1. Ventriculomegaly, slightly progressed from 06/15/2014. This does not appear significantly disproportionate to the cerebral sulci. This however does not exclude the diagnosis of normal pressure hydrocephalus in the appropriate context. 2. Moderate findings of follow-up chronic small vessel disease as further detailed above. 3. Findings consistent with sequelae of remote microhemorrhage in the left posterior frontal lobe.  CT Lumbar spine without contrast 05/07/2021: 1. No acute fracture or listhesis. There is chronic-appearing anterolisthesis of L4 on  L5. 2. 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  COGNITION: Overall cognitive status: WFL given history   POSTURE:  In standing, increased R shoulder height, increased rounded shoulders, forward head posture  Posterior pelvic tilt in sitting  Thoracic kyphosis: increased in standing and sitting Iliac crest height: L higher   GAIT: Distance walked: ~178ft Assistive device utilized: Use of SPC on/off  Level of assistance: CGA Comments: decreased step length, downward gaze (more probable due to impaired vision), decreased hip flexion B, poor clearance of B feet (no LOB)    SENSATION: Light touch: Intact at L2-S2 dermatomes   LUMBAR AROM/PROM: Deferred 2/2 time constraints   AROM (Normal range in degrees) AROM  eval  Flexion (65)   Extension (25-30)   Right lateral flexion (25)   Left lateral flexion (25)   Right rotation (30)   Left rotation (30)    (*= pain, Blank rows = not tested)  LOWER EXTREMITY ROM: Deferred 2/2 time constraints   ROM (Normal range in degrees) Right eval Left eval  Hip flexion (0-125)    Hip extension (0-15)    Hip abduction (0-40)    Hip adduction    Hip internal rotation (0-45)    Hip external rotation (0-45)    Knee flexion    Knee extension    Ankle dorsiflexion    Ankle plantarflexion    Ankle inversion    Ankle eversion     (*= pain, Blank rows = not tested)  LOWER EXTREMITY MMT:  MMT Right eval Left eval  Hip flexion 4 4  Hip extension 4 4  Hip abduction    Hip adduction    Hip internal rotation    Hip external rotation    Knee flexion 5 5  Knee extension 4 4  Ankle dorsiflexion 5 5  Ankle plantarflexion    Ankle inversion    Ankle eversion    (*= pain, Blank rows = not tested)  MUSCLE LENGTH:   PHYSICAL PERFORMANCE MEASURES:  STS: Gower's sign and inability to control eccentrically   RLE SLS:  LLE SLS:  6 MWT:  10MWT: Deferred 2/2 time constraints   5TSTS: 14 seconds    EXTERNAL PELVIC EXAM: Patient educated  on the purpose of the pelvic exam and articulated  understanding; patient consented to the exam verbally. Deferred at this time.  Palpation: Breath coordination: present/absent/inconsistent Voluntary Contraction: present/absent Relaxation: full/delayed/non-relaxing Perineal movement with sustained IAP increase ("bear down"): descent/no change/elevation/excessive descent Perineal movement with rapid IAP increase ("cough"): elevation/no change/descent Pubic symphysis: (0= no contraction, 1= flicker, 2= weak squeeze, 3= fair squeeze with lift, 4= good squeeze and lift against resistance, 5= strong squeeze against strong resistance)    Patient Education:  Patient educated on what to expect during course of physical therapy, POC, addressing balance/gait impairments vs urinary urgency/frequency and provided with HEP including: seated hip marches and heel/toe raises. Patient verbalized understanding and returned demonstration. Patient will benefit from further education in order to maximize compliance and understanding for long-term therapeutic gains.   Patient Surveys:  Will assess next visit     ASSESSMENT:  Clinical Impression: Patient is a 76 y.o. who was seen today for physical therapy evaluation and treatment for a chief complaint of impaired balance and gait. PLOF included IND with mobility and ADLs with no use of AD; however, Pt currently uses a SPC and RW inside/outside the home for mobility. Although Pt has some concern with her urinary frequency/urgency, she would like to focus on her balance/gait primarily and incorporate pelvic floor when appropriate. Upon physical examination Pt demonstrates deficits in gait, balance, muscle strength, ROM, and posture as evidenced by increased L iliac crest height, rounded shoulders/forward head posture, increased R shoulder height, poor gait mechanics primarily in hip flexion, hip extension, and foot clearance, weakness in B hip flexors, hip extensors  and knee extensors, and scoring 14 seconds on the 5TSTS with use of a SPC (increased risk for falls). Patient's progress may be limited due to age, vision changes, past neurological history, and living alone; however, patient's motivation is advantageous. Pt with basic understanding of strengthening lower extremity mm and improving posture to aid in improving gait mechanics. Patient will benefit from skilled therapeutic intervention to address deficits in gait, balance, muscle strength, ROM, and posture in order to increase PLOF and improve overall QOL.    Objective Impairments: Abnormal gait, decreased activity tolerance, decreased balance, decreased endurance, decreased mobility, difficulty walking, decreased strength, decreased safety awareness, and postural dysfunction.   Activity Limitations: carrying, sitting, standing, stairs, transfers, continence, and locomotion level  Personal Factors: Age, Past/current experiences, Time since onset of injury/illness/exacerbation, and 1-2 comorbidities: severe lumbar stenosis, ventriculomegaly are also affecting patient's functional outcome.   Rehab Potential: Good  Clinical Decision Making: Evolving/moderate complexity  Evaluation Complexity: Moderate   GOALS: Goals reviewed with patient? Yes  SHORT TERM GOALS: Target date: 01/09/22  Patient will be able to walk >/= 155ft with LRAD and no more than SPV in order to transition towards independence in the home and in the community while minimizing risk of falls.  Baseline: ~143ft with SPC and CGA Goal status: INITIAL   LONG TERM GOALS: Target date: 02/20/22  Patient will be independent in the performance of a HEP in order to improve muscular strength, gait mechanics, and balance.  Baseline: seated hip flexion/toe/heel raises  Goal status: INITIAL  2.  Patient will be able to improve 5TSTS score to </= 12 seconds in order to match age-predicted norms and reduce risk for falls in the home and  community.  Baseline: 14 seconds Goal status: INITIAL  3. Patient will be able to walk IND >/= 153ft with LRAD  in order to demonstrate improved balance and carryover into mobility in the home and in the community. Baseline: unable  to walk 140ft due to tolerance Goal status: INITIAL   PLAN: PT Frequency: 2x/week  PT Duration: 6 weeks  Planned Interventions: Therapeutic exercises, Therapeutic activity, Neuromuscular re-education, Balance training, Gait training, Patient/Family education, Joint mobilization, Stair training, Moist heat, Taping, and Manual therapy  Plan For Next Session: 10MWT, do FOTO, finish physical assessment    Richey Doolittle, PT, DPT  11/28/2021, 3:19 PM

## 2021-11-28 NOTE — Therapy (Deleted)
OUTPATIENT PHYSICAL THERAPY FEMALE PELVIC EVALUATION   Patient Name: Karen Frank MRN: [id number] DOB:27-Oct-1945, 76 y.o., female Today's Date: 11/28/2021   PT End of Session - 11/28/21 1501     Visit Number 1    Number of Visits 12    Date for PT Re-Evaluation 02/20/22    Authorization Type IE: 11/28/21    PT Start Time 1505    PT Stop Time 1550    PT Time Calculation (min) 45 min    Equipment Utilized During Treatment Gait belt    Activity Tolerance Patient tolerated treatment well             History reviewed. No pertinent past medical history. History reviewed. No pertinent surgical history. There are no problems to display for this patient.   PCP: ***  REFERRING PROVIDER: ***  REFERRING DIAG: ***  THERAPY DIAG:  Impairment of balance  Abnormal posture  Other abnormalities of gait and mobility  Rationale for Evaluation and Treatment: {HABREHAB:27488}  ONSET DATE: ***  RED FLAGS: *** Have you had any night sweats? Unexplained weight loss? Saddle anesthesia? Unexplained changes in bowel or bladder habits?   SUBJECTIVE: Patient confirms identification and approves PT to assess pelvic floor and treatment {yes/no:20286}                                                                                                                                                                                           PRECAUTIONS: {Therapy precautions:24002}  WEIGHT BEARING RESTRICTIONS: {Yes ***/No:24003}  FALLS:  Has patient fallen in last 6 months? {fallsyesno:27318}  OCCUPATION/SOCIAL ACTIVITIES: ***  PLOF: {PLOF:24004}  PERTINENT HISTORY/CHART REVIEW: Sexual abuse: {Yes/No:304960894}   CHIEF COMPLAINT: ***   PAIN:  Are you having pain? {yes/no:20286} NPRS scale: ***/10 Pain location: {pelvic pain location:27098}  Pain type: {type:313116} Pain description: {PAIN DESCRIPTION:21022940}   Aggravating factors: *** Relieving factors: ***   LIVING  ENVIRONMENT: Lives with: {[facility] lives with:25569::"lives with their family"} Lives in: {Lives in:25570} Stairs: {opstairs:27293} Has following equipment at home: {Assistive devices:23999}   PATIENT GOALS: ***    UROLOGICAL HISTORY Fluid intake: {Yes/No:304960894}  Pain with urination: {yes/no:20286} Fully empty bladder: {Yes/No:304960894}, intermittent  Stream: {PT urination:27102} Urgency: {Yes/No:304960894} Frequency: *** Leakage: {PT leakage:27103} Pads: {Yes/No:304960894} Type: *** Amount: ***  GASTROINTESTINAL HISTORY Pain with bowel movement: {yes/no:20286} Type of bowel movement (Bristol Stool Chart):{PT BM type:27100} Frequency: *** Fully empty rectum: {Yes/No:304960894} Leakage: {Yes/No:304960894} Pads: {Yes/No:304960894} Fiber supplement: {Yes/No:304960894}   SEXUAL HISTORY/FUNCTION Pain with intercourse: {pain with intercourse PA:27099} Ability to have vaginal penetration:  {Yes/No:304960894}; Deep thrusting: *** Able to achieve orgasm?: ***  OBSTETRICAL HISTORY Vaginal deliveries: *** Tearing: {Yes***/No:304960894}  C-section deliveries: *** Currently pregnant: {Yes***/No:304960894}  GYNECOLOGICAL HISTORY Hysterectomy: yes/no, Vaginal/Abdominal Pelvic Organ Prolapse: {PT prolapse:27101} Pain with exam: yes no Heaviness/pressure: yes no   OBJECTIVE:   DIAGNOSTIC TESTING/IMPRESSIONS:  ***  COGNITION: Overall cognitive status: {cognition:24006}     POSTURE:  Grossly *** Lumbar lordosis:   Thoracic kyphosis: Iliac crest height:  Lumbar lateral shift:  Pelvic obliquity:  Leg length discrepancy:   GAIT: Distance walked: *** Assistive device utilized: {Assistive devices:23999} Level of assistance: {Levels of assistance:24026} Comments: ***  Trendelenburg: ***  SENSATION: Light touch: {intact/deficits:24005}, L2-S2 dermatomes  Proprioception: {intact/deficits:24005}  LUMBAR AROM/PROM:  AROM (Normal range in degrees) AROM  eval   Flexion (65)   Extension (25-30)   Right lateral flexion (25)   Left lateral flexion (25)   Right rotation (30)   Left rotation (30)    (*= pain, Blank rows = not tested)  LOWER EXTREMITY ROM:  {AROM/PROM:27142} ROM (Normal range in degrees) Right eval Left eval  Hip flexion (0-125)    Hip extension (0-15)    Hip abduction (0-40)    Hip adduction    Hip internal rotation (0-45)    Hip external rotation (0-45)    Knee flexion    Knee extension    Ankle dorsiflexion    Ankle plantarflexion    Ankle inversion    Ankle eversion     (*= pain, Blank rows = not tested)  LOWER EXTREMITY MMT:  MMT Right eval Left eval  Hip flexion    Hip extension    Hip abduction    Hip adduction    Hip internal rotation    Hip external rotation    Knee flexion    Knee extension    Ankle dorsiflexion    Ankle plantarflexion    Ankle inversion    Ankle eversion    (*= pain, Blank rows = not tested)  MUSCLE LENGTH: Hamstrings: Right *** deg; Left *** deg Thomas test: Right *** deg; Left *** deg  LUMBAR SPECIAL TESTS:  {lumbar special test:25242}  PHYSICAL PERFORMANCE MEASURES:  STS:  RLE SLS:  LLE SLS:  6 MWT:  :  5TSTS:   PALPATION: Abdominal:  Diastasis: *** finger above umbilicus, *** fingers at and below umbilicus  Scar mobility: present/mobile perpendicular, parallel Rib flare: present/absent  EXTERNAL PELVIC EXAM: Patient educated on the purpose of the pelvic exam and articulated understanding; patient consented to the exam verbally. Palpation: Breath coordination: present/absent/inconsistent Voluntary Contraction: present/absent Relaxation: full/delayed/non-relaxing Perineal movement with sustained IAP increase ("bear down"): descent/no change/elevation/excessive descent Perineal movement with rapid IAP increase ("cough"): elevation/no change/descent Pubic symphysis: (0= no contraction, 1= flicker, 2= weak squeeze, 3= fair squeeze with lift, 4= good  squeeze and lift against resistance, 5= strong squeeze against strong resistance)   INTERNAL PELVIC EXAM: Patient educated on the purpose of the pelvic exam and articulated understanding; patient consented to the exam verbally. deferred 2/2 to time constraints Introitus Appears:  Skin integrity:  Scar mobility: Strength (PERF):  Symmetry: Palpation: Prolapse: (0= no contraction, 1= flicker, 2= weak squeeze, 3= fair squeeze with lift, 4= good squeeze and lift against resistance, 5= strong squeeze against strong resistance)    TODAY'S TREATMENT: EVAL ***  Manual Therapy:   Neuromuscular Re-education:   Therapeutic Exercise:   Patient Education:  Education details: *** Person educated: {Person educated:25204} Education method: {Education Method:25205} Education comprehension: {Education Comprehension:25206}  Patient Surveys:  {rehab surveys:24030}    ASSESSMENT:  Clinical Impression: Patient is a *** y.o. who was seen  today for physical therapy evaluation and treatment for a chief complaint of ***. PLOF. Today's evaluation suggest deficits in PFM coordination, breathing mechanics, PFM extensibility, posture, pain, scar mobility, gait, balance, hypermobility, as evidenced by ***. Upon physical examination Pt demonstrates *** as evidenced by ***. Patient's responses on FOTO *** indicates *** limitation/disability/distress. Patient's progress may be limited due to *** ; however, patient's *** is advantageous. Pt with basic understanding of ***. Patient will benefit from skilled therapeutic intervention to address deficits in *** in order to increase PLOF and improve overall QOL.    Objective Impairments: {opptimpairments:25111}.   Activity Limitations: {activitylimitations:27494}  Personal Factors: {Personal factors:25162} are also affecting patient's functional outcome.   Rehab Potential: {rehabpotential:25112}  Clinical Decision Making: {clinical decision  making:25114}  Evaluation Complexity: {Evaluation complexity:25115}   GOALS: Goals reviewed with patient? {yes/no:20286}  SHORT TERM GOALS: Target date: {follow up:25551}  *** Baseline: Goal status: {GOALSTATUS:25110}  2.  *** Baseline:  Goal status: {GOALSTATUS:25110}  3.  *** Baseline:  Goal status: {GOALSTATUS:25110}  4.  *** Baseline:  Goal status: {GOALSTATUS:25110}  5.  *** Baseline:  Goal status: {GOALSTATUS:25110}  6.  *** Baseline:  Goal status: {GOALSTATUS:25110}  LONG TERM GOALS: Target date: {follow up:25551}   *** Baseline:  Goal status: {GOALSTATUS:25110}  2.  *** Baseline:  Goal status: {GOALSTATUS:25110}  3.  *** Baseline:  Goal status: {GOALSTATUS:25110}  4.  *** Baseline:  Goal status: {GOALSTATUS:25110}  5.  *** Baseline:  Goal status: {GOALSTATUS:25110}  6.  *** Baseline:  Goal status: {GOALSTATUS:25110}  PLAN: PT Frequency: {rehab frequency:25116}  PT Duration: {rehab duration:25117}  Planned Interventions: {rehab planned interventions:25118::"Therapeutic exercises","Therapeutic activity","Neuromuscular re-education","Balance training","Gait training","Patient/Family education","Joint mobilization"}  Plan For Next Session: ***   Tamicka Shimon E Saul Dorsi, PT 11/28/2021, 4:12 PM
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
## 2021-12-05 ENCOUNTER — Ambulatory Visit: Payer: Medicare Other

## 2021-12-05 DIAGNOSIS — R293 Abnormal posture: Secondary | ICD-10-CM

## 2021-12-05 DIAGNOSIS — R2689 Other abnormalities of gait and mobility: Secondary | ICD-10-CM | POA: Diagnosis not present

## 2021-12-05 NOTE — Therapy (Signed)
OUTPATIENT PHYSICAL THERAPY FEMALE PELVIC TREATMENT   Patient Name: Karen Frank MRN: [id number] DOB:01/15/1946, 76 y.o., female Today's Date: 12/05/2021   PT End of Session - 12/05/21 1435     Visit Number 2    Number of Visits 12    Date for PT Re-Evaluation 01/10/22    PT Start Time 1530    PT Stop Time 1610    PT Time Calculation (min) 40 min    Equipment Utilized During Treatment Gait belt    Activity Tolerance Patient tolerated treatment well             No past medical history on file. No past surgical history on file. There are no problems to display for this patient.   PCP: Fonnie Mu, MD  REFERRING PROVIDER: Lonell Face, MD  REFERRING DIAG: R26.89 (ICD-10-CM) - Imbalance  THERAPY DIAG:  Impairment of balance  Abnormal posture  Other abnormalities of gait and mobility  Rationale for Evaluation and Treatment Rehabilitation  ONSET DATE: About a year ago                                                                                                                                                                                  PRECAUTIONS: None  WEIGHT BEARING RESTRICTIONS: No  FALLS:  Has patient fallen in last 6 months? No  OCCUPATION/SOCIAL ACTIVITIES: walking outside, activities around the home  PLOF: Independent with gait  PERTINENT HISTORY/CHART REVIEW: Internal medicine appointment 12/11/21 and neurology appointment 03/13/22   CHIEF CONCERN: Patient states that she abruptly lost the ability to walk last Nov-Dec 2022. Upon further explanation, Pt felt weak in her legs and began to use a RW, mobilizing slowly but denying any pain. Patient denies numbness/tingling in the lower extremities. Pt began PT earlier this year and therapy helped and she began to walk better. She felt well enough to not use the RW or SPC while ambulating in the home. Then all of a sudden, Pt went to physical therapy and couldn't walk "normal" again (a few  months ago). Pt went to the ED 11/07/21 and waited for 4 hours without being seen. According to ED notes due to "rapid change for confusion and gait." Pt denies pain in sitting or walking. In the home Pt uses mainly the [facility] but occasionally uses the RW, but it is old and she would like a new one. Pt states sometimes she doesn't use anything at all but notices she walks very slowly. Patient has a carpeted home with no STE and is one-level. In addition, patient has an appointment with a vision specialist tomorrow due to visual changes as she feels like "she's losing her  vision" which DPT explained could also be impacting her walking speed.    PATIENT GOALS: To be able to walk further and more confidently without risking a fall     SUBJECTIVE:        Pt states no changes from last visit. Patient is wearing shoe lift in the R shoe and is not causing pain or discomfort. Will continue to wear. Pt reports seeing vision doctor and will be having surgery. Will let us know when.   PAIN:  Are you having pain? No    OBJECTIVE:   DIAGNOSTIC TESTING/IMPRESSIONS:  IMPRESSION: (06/22/21) 1. Ventriculomegaly, slightly progressed from 06/15/2014. This does not appear significantly disproportionate to the cerebral sulci. This however does not exclude the diagnosis of normal pressure hydrocephalus in the appropriate context. 2. Moderate findings of follow-up chronic small vessel disease as further detailed above. 3. Findings consistent with sequelae of remote microhemorrhage in the left posterior frontal lobe.  CT Lumbar spine without contrast 05/07/2021: 1. No acute fracture or listhesis. There is chronic-appearing anterolisthesis of L4 on L5. 2. 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  COGNITION: Overall cognitive status: WFL given history   POSTURE:  In standing, increased R shoulder height, increased rounded shoulders, forward head posture  Posterior pelvic tilt in sitting  Thoracic kyphosis:  increased in standing and sitting Iliac crest height: L higher   GAIT: 11/28/21  Distance walked: ~136ft Assistive device utilized: Use of SPC on/off  Level of assistance: CGA Comments: decreased step length, downward gaze (more probable due to impaired vision), decreased hip flexion B, poor clearance of B feet (no LOB)    SENSATION: Light touch: Intact at L2-S2 dermatomes   LUMBAR AROM/PROM: Deferred 2/2 time constraints   AROM (Normal range in degrees) AROM  12/05/21  Flexion (65)   Extension (25-30)   Right lateral flexion (25)   Left lateral flexion (25)   Right rotation (30)   Left rotation (30)    (*= pain, Blank rows = not tested)  LOWER EXTREMITY ROM:   PROM (Normal range in degrees) Right 12/05/21 Left 12/05/21  Hip flexion (0-125) In sidelying (restricted) WFL  Hip extension (0-15)    Hip abduction (0-40) [facility]  Hip adduction    Hip internal rotation (0-45)    Hip external rotation (0-45)    Knee flexion [facility]  Knee extension    Ankle dorsiflexion    Ankle plantarflexion    Ankle inversion    Ankle eversion     (*= pain, Blank rows = not tested)  LOWER EXTREMITY MMT:  MMT Right 11/27/21 Left 11/27/21  Hip flexion 4 4  Hip extension 4 4  Hip abduction 4  4  Hip adduction    Hip internal rotation    Hip external rotation    Knee flexion 5 5  Knee extension 4 4  Ankle dorsiflexion 5 5  Ankle plantarflexion    Ankle inversion    Ankle eversion    (*= pain, Blank rows = not tested)   PHYSICAL PERFORMANCE MEASURES:  STS: Gower's sign and inability to control descent   RLE SLS:  LLE SLS: : .59 m/s (limited community ambulator) (12/05/21)  5TSTS: 14 seconds (11/27/21)   TODAY'S TREATMENT: Pre-treatment assessment consisted on , LE ROM, and MMT (see above for results)    Manual Therapy: L Sidelying hip flexor stretch (R), 3x30 secs, for improved muscle elasticity to aid in gait mechanics and hip flexor strength    Neuromuscular  Re-education: Seated  B hip flexor stretch for improved posture and muscle elasticity, 2 x30 seconds with VCs for proper technique  Seated scapular retractions for improved posture in standing and sitting, 10 x 3 sec hold, with VCs and TCs for proper technique  Seated heel/toe raises for improved muscular strength to aid in B foot clearance during gait   One bout of gait x453ft with significant VCs, no AD and CGA, to increase B stride length and B hip flexion throughout trial Pt demonstrates B arm swing during gait but uses as a compensation for decreased stride length    Patient response to interventions: After 337ft, Pt at 8/10 RPE and required seated rest break.    Patient Education:  Patient educated on proper gait mechanics and discussion on musculature tension that can inhibit proper mechanics. Also, Pt provided with HEP including: [id number]. Patient verbalized understanding and returned demonstration. Patient will benefit from further education in order to maximize compliance and understanding for long-term therapeutic gains.  Patient educated throughout session on appropriate technique and form using multi-modal cueing, HEP, and activity modification.    Patient Surveys:  FOTO Balance 41    ASSESSMENT:  Clinical Impression: Patient presents to clinic with motivation to participate in today's session. Upon further physical assessment Pt demonstrates deficits in  in gait, balance, LE muscular strength, ROM, and posture as evidenced by increased L iliac crest height, rounded shoulders/forward head posture, increased R shoulder height, increased B hip flexor tension (more on R), a gait speed of .59 m/s on with no use of AD and CGA. Pt is at an increase risk for falls and is categorized as a limited community ambulator. Patient responded well to both educational and active interventions with an RPE 8/10 at end of gait trial (~377ft) with no AD and CGA. Patient requires significant  cueing during gait and demonstrates decreased stride length and foot clearance with observed compensation of B arm swing to assist in speed. Patient will continue to benefit from skilled therapeutic intervention to address deficits in gait, balance, LE muscle strength, ROM, and posture in order to increase PLOF and improve overall QOL.    Objective Impairments: Abnormal gait, decreased activity tolerance, decreased balance, decreased endurance, decreased mobility, difficulty walking, decreased strength, decreased safety awareness, and postural dysfunction.   Activity Limitations: carrying, sitting, standing, stairs, transfers, continence, and locomotion level  Personal Factors: Age, Past/current experiences, Time since onset of injury/illness/exacerbation, and 1-2 comorbidities: severe lumbar stenosis, ventriculomegaly are also affecting patient's functional outcome.   Rehab Potential: Good  Clinical Decision Making: Evolving/moderate complexity  Evaluation Complexity: Moderate   GOALS: Goals reviewed with patient? Yes  SHORT TERM GOALS: Target date: 12/20/21  Patient will be able to walk >/= 169ft with LRAD and no more than SPV in order to transition towards independence in the home and in the community while minimizing risk of falls.  Baseline: ~139ft with SPC and CGA Goal status: INITIAL   LONG TERM GOALS: Target date: 01/10/22  Patient will be independent in the performance of a HEP in order to improve muscular strength, gait mechanics, and balance.  Baseline: seated hip flexion/toe/heel raises, (6/20) [id number],   Goal status: INITIAL  2.  Patient will be able to improve 5TSTS score to </= 12 seconds in order to match age-predicted norms and reduce risk for falls in the home and community.  Baseline: 14 seconds Goal status: INITIAL  3. Patient will be able to walk IND >/= 170ft with LRAD  in order to demonstrate  improved balance and carryover into mobility in the home and in the  community. Baseline: unable to walk 142ft due to tolerance, (6/20): ~345ft no AD, CGA Goal status: INITIAL   PLAN: PT Frequency: 2x/week  PT Duration: 6 weeks  Planned Interventions: Therapeutic exercises, Therapeutic activity, Neuromuscular re-education, Balance training, Gait training, Patient/Family education, Joint mobilization, Stair training, Moist heat, Taping, and Manual therapy  Plan For Next Session: warm-up, log roll technique, open books, standing LE strength, walking   [employer], PT, DPT  12/05/2021, 3:30PM

## 2021-12-13 ENCOUNTER — Ambulatory Visit: Payer: Medicare Other

## 2021-12-13 DIAGNOSIS — R293 Abnormal posture: Secondary | ICD-10-CM

## 2021-12-13 DIAGNOSIS — R2689 Other abnormalities of gait and mobility: Secondary | ICD-10-CM

## 2021-12-13 NOTE — Therapy (Signed)
OUTPATIENT PHYSICAL THERAPY FEMALE PELVIC TREATMENT   Patient Name: Karen Frank MRN: [id number] DOB:1946/06/04, 76 y.o., female Today's Date: 12/13/2021   PT End of Session - 12/13/21 1351     Visit Number 3    Number of Visits 12    Date for PT Re-Evaluation 01/10/22    PT Start Time 1355    PT Stop Time 1438    PT Time Calculation (min) 43 min    Activity Tolerance Patient tolerated treatment well             No past medical history on file. No past surgical history on file. There are no problems to display for this patient.   PCP: Fonnie Mu, MD  REFERRING PROVIDER: Lonell Face, MD  REFERRING DIAG: R26.89 (ICD-10-CM) - Imbalance  THERAPY DIAG:  Impairment of balance  Abnormal posture  Other abnormalities of gait and mobility  Rationale for Evaluation and Treatment Rehabilitation  ONSET DATE: About a year ago                                                                                                                                                                                  PRECAUTIONS: None  WEIGHT BEARING RESTRICTIONS: No  FALLS:  Has patient fallen in last 6 months? No  OCCUPATION/SOCIAL ACTIVITIES: walking outside, activities around the home  PLOF: Independent with gait  PERTINENT HISTORY/CHART REVIEW: Internal medicine appointment 12/11/21 and neurology appointment 03/13/22   OBJECTIVE:   DIAGNOSTIC TESTING/IMPRESSIONS:  IMPRESSION: (06/22/21) 1. Ventriculomegaly, slightly progressed from 06/15/2014. This does not appear significantly disproportionate to the cerebral sulci. This however does not exclude the diagnosis of normal pressure hydrocephalus in the appropriate context. 2. Moderate findings of follow-up chronic small vessel disease as further detailed above. 3. Findings consistent with sequelae of remote microhemorrhage in the left posterior frontal lobe.  CT Lumbar spine without contrast 05/07/2021: 1. No acute  fracture or listhesis. There is chronic-appearing anterolisthesis of L4 on L5. 2. 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   SUBJECTIVE:       Pt states trying hip flexor stretch seated at home for the first time last week and felt a pain. Pt kept stretching until she couldn't anymore. Pt advised to discontinue stretch if it becomes painful and to stretch only to tolerance with a hold for 15-20 secs. However, the next day the Pt tried the hip flexor stretch and did not feel the pain. Pt feels her walking has been worse and is using the [facility] more in the home. Pt denies any instances of instability or numbness/tingling in the LE.   PAIN:  Are you having pain? No   POSTURE: (12/05/21)  In standing, increased R shoulder height, increased rounded shoulders, forward head posture  Posterior pelvic tilt in sitting  Thoracic kyphosis: increased in standing and sitting Iliac crest height: L higher   GAIT: 11/28/21  Distance walked: ~152ft Assistive device utilized: Use of SPC on/off  Level of assistance: CGA Comments: decreased step length, downward gaze (more probable due to impaired vision), decreased hip flexion B, poor clearance of B feet (no LOB)    SENSATION: Light touch: Intact at L2-S2 dermatomes   LUMBAR AROM/PROM: Deferred 2/2 time constraints   AROM (Normal range in degrees) AROM   Flexion (65)   Extension (25-30)   Right lateral flexion (25)   Left lateral flexion (25)   Right rotation (30)   Left rotation (30)    (*= pain, Blank rows = not tested)  LOWER EXTREMITY ROM:   PROM (Normal range in degrees) Right 12/05/21 Left 12/05/21  Hip flexion (0-125) In sidelying (restricted) WFL  Hip extension (0-15)    Hip abduction (0-40) [facility]  Hip adduction    Hip internal rotation (0-45)    Hip external rotation (0-45)    Knee flexion [facility]  Knee extension    Ankle dorsiflexion    Ankle plantarflexion    Ankle inversion    Ankle eversion     (*= pain, Blank rows = not  tested)  LOWER EXTREMITY MMT:  MMT Right 11/27/21 Left 11/27/21  Hip flexion 4 4  Hip extension 4 4  Hip abduction 4  4  Hip adduction    Hip internal rotation    Hip external rotation    Knee flexion 5 5  Knee extension 4 4  Ankle dorsiflexion 5 5  Ankle plantarflexion    Ankle inversion    Ankle eversion    (*= pain, Blank rows = not tested)   PHYSICAL PERFORMANCE MEASURES:  STS: Gower's sign and inability to control descent   RLE SLS:  LLE SLS: : .59 m/s (limited community ambulator) (12/05/21)  5TSTS: 14 seconds (11/27/21)   TODAY'S TREATMENT:   Therapeutic Exercise: Brief review of hip flexor stretch and stretch duration to maximize lengthening.   Nustep aerobic machine (seat 6) with use of LE/UE, continued monitoring of RPE 6 mins: 5/10, Lvl 1 10 mins- 7/10, Lvl 1  STSs, 2x10 with supervision and VC for proper technique (trunk flexion forward and bringing B feet under hips) and efficiency First bout: 7/10 RPE with seated rest break (2 minutes)  Second bout: increased labored breathing and required seated rest break Noticed increased muscular fatigue in standing during intervention   Neuromuscular Re-education: Standing step taps on staircase for improved hip flexor mobility to aid in gait mechanics, 2x10 B with use of handrails  Noted fatigue during second x10 when cued to use single handrail vs bilateral handrails  VC required to increase hip flexion in order to clear stair when bringing foot back down  Attempted gait with SPC but Pt with increased bodily compensations and unable to keep proper technique with LE and cane.   One bout of gait x22ft with moderate VCs, no AD and CGA, to increase B stride length and R hip flexion throughout trial  Pt demonstrates B arm swing during gait but uses as a compensation for decreased stride length   Disucssion on using the RW when Pt is able to retrieve one from the medical supply store, when feeling more off  balance as RW provides bilateral support and decreased instances of bodily compensations.  Patient educated throughout session on appropriate technique and form using multi-modal cueing, HEP, and activity modification.   Patient response to interventions: End of session, Pt at 7/10 RPE and legs are fatigued.   Patient Education:  Pt provided with added intervention to HEP: STSs at countertop.  Patient verbalized understanding and returned demonstration. Patient will benefit from further education in order to maximize compliance and understanding for long-term therapeutic gains.    ASSESSMENT:  Clinical Impression: Patient presents to clinic with motivation to participate in today's session. Pt continues to demonstrate deficits in gait, balance, LE muscular strength, ROM, and posture. Pt reports increased use of the SPC in the home recently, but denies any instability or N/T in the LE. Pt given signed form to bring to medical supply store to acquire a RW for better bilateral support. Pt demonstrated ability to perform multiple active interventions targeting LE for improved mobility/strength to aid in gait mechanics with an RPE as high as 7/10. During STSs Pt demonstrated noticeable muscular fatigue and required seated rest breaks. Patient continues to require moderate cueing during gait, CGA and no AD, and demonstrates decreased stride length and R foot clearance. Pt responded well to all other active and educational interventions. Patient will continue to benefit from skilled therapeutic intervention to address deficits in gait, balance, LE muscle strength, ROM, and posture in order to increase PLOF and improve overall QOL.    Objective Impairments: Abnormal gait, decreased activity tolerance, decreased balance, decreased endurance, decreased mobility, difficulty walking, decreased strength, decreased safety awareness, and postural dysfunction.   Activity Limitations: carrying, sitting,  standing, stairs, transfers, continence, and locomotion level  Personal Factors: Age, Past/current experiences, Time since onset of injury/illness/exacerbation, and 1-2 comorbidities: severe lumbar stenosis, ventriculomegaly are also affecting patient's functional outcome.   Rehab Potential: Good  Clinical Decision Making: Evolving/moderate complexity  Evaluation Complexity: Moderate   GOALS: Goals reviewed with patient? Yes  SHORT TERM GOALS: Target date: 12/20/21  Patient will be able to walk >/= 151ft with LRAD and no more than SPV in order to transition towards independence in the home and in the community while minimizing risk of falls.  Baseline: ~153ft with SPC and CGA Goal status: INITIAL   LONG TERM GOALS: Target date: 01/10/22  Patient will be independent in the performance of a HEP in order to improve muscular strength, gait mechanics, and balance.  Baseline: seated hip flexion/toe/heel raises, (6/20) [id number],   Goal status: INITIAL  2.  Patient will be able to improve 5TSTS score to </= 12 seconds in order to match age-predicted norms and reduce risk for falls in the home and community.  Baseline: 14 seconds Goal status: INITIAL  3. Patient will be able to walk IND >/= 176ft with LRAD  in order to demonstrate improved balance and carryover into mobility in the home and in the community. Baseline: unable to walk 158ft due to tolerance, (6/20): ~313ft no AD, CGA Goal status: INITIAL   PLAN: PT Frequency: 2x/week  PT Duration: 6 weeks  Planned Interventions: Therapeutic exercises, Therapeutic activity, Neuromuscular re-education, Balance training, Gait training, Patient/Family education, Joint mobilization, Stair training, Moist heat, Taping, and Manual therapy  Plan For Next Session: warm-up, STS, standing marches/LE strength, walking    [employer], PT, DPT  12/05/2021, 3:30PM

## 2021-12-20 ENCOUNTER — Ambulatory Visit: Payer: Medicare Other | Attending: Neurology

## 2021-12-20 DIAGNOSIS — R293 Abnormal posture: Secondary | ICD-10-CM | POA: Diagnosis present

## 2021-12-20 DIAGNOSIS — R2689 Other abnormalities of gait and mobility: Secondary | ICD-10-CM | POA: Insufficient documentation

## 2021-12-20 NOTE — Therapy (Signed)
OUTPATIENT PHYSICAL THERAPY FEMALE PELVIC TREATMENT   Patient Name: Karen Frank MRN: [id number] DOB:[birth year], 76 y.o., female Today's Date: 12/21/2021   PT End of Session - 12/20/21 1359     Visit Number 4    Number of Visits 12    Date for PT Re-Evaluation 01/10/22    PT Start Time 1400    PT Stop Time 1442    PT Time Calculation (min) 42 min    Equipment Utilized During Treatment Gait belt    Activity Tolerance Patient tolerated treatment well             History reviewed. No pertinent past medical history. History reviewed. No pertinent surgical history. There are no problems to display for this patient.   PCP: Fonnie Mu, MD  REFERRING PROVIDER: Lonell Face, MD  REFERRING DIAG: R26.89 (ICD-10-CM) - Imbalance  THERAPY DIAG:  Impairment of balance  Abnormal posture  Other abnormalities of gait and mobility  Rationale for Evaluation and Treatment Rehabilitation  ONSET DATE: About a year ago                                                                                                                                                                                  PRECAUTIONS: None  WEIGHT BEARING RESTRICTIONS: No  FALLS:  Has patient fallen in last 6 months? No  OCCUPATION/SOCIAL ACTIVITIES: walking outside, activities around the home  PLOF: Independent with gait  PERTINENT HISTORY/CHART REVIEW: Internal medicine appointment 12/11/21 and neurology appointment 03/13/22   OBJECTIVE:   DIAGNOSTIC TESTING/IMPRESSIONS:  IMPRESSION: (06/22/21) 1. Ventriculomegaly, slightly progressed from 06/15/2014. This does not appear significantly disproportionate to the cerebral sulci. This however does not exclude the diagnosis of normal pressure hydrocephalus in the appropriate context. 2. Moderate findings of follow-up chronic small vessel disease as further detailed above. 3. Findings consistent with sequelae of remote microhemorrhage in the  left posterior frontal lobe.  CT Lumbar spine without contrast 05/07/2021: 1. No acute fracture or listhesis. There is chronic-appearing anterolisthesis of L4 on L5. 2. 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   SUBJECTIVE:       Pt states doing HEP at home but when she tries the hip flexor stretch, she can't walk the next day. Pt reports no pain while doing the stretch, but it is an observation she has made. Pt feels her gait has gotten worse and has to use her cane most of the day even walking to and from the kitchen or other rooms in her home. Pt did not see email about purchasing a RW from medical supply store. DPT printed out stores with numbers/addresses.   PAIN:  Are you having pain? No  POSTURE: (12/05/21) In standing, increased R shoulder height, increased rounded shoulders, forward head posture  Posterior pelvic tilt in sitting  Thoracic kyphosis: increased in standing and sitting Iliac crest height: L higher   GAIT: 11/28/21  Distance walked: ~131ft Assistive device utilized: Use of SPC on/off  Level of assistance: CGA Comments: decreased step length, downward gaze (more probable due to impaired vision), decreased hip flexion B, poor clearance of B feet (no LOB)    SENSATION: Light touch: Intact at L2-S2 dermatomes   LUMBAR AROM/PROM: Deferred 2/2 time constraints   AROM (Normal range in degrees) AROM   Flexion (65)   Extension (25-30)   Right lateral flexion (25)   Left lateral flexion (25)   Right rotation (30)   Left rotation (30)    (*= pain, Blank rows = not tested)  LOWER EXTREMITY ROM:   PROM (Normal range in degrees) Right 12/05/21 Left 12/05/21  Hip flexion (0-125) In sidelying (restricted) WFL  Hip extension (0-15)    Hip abduction (0-40) [facility]  Hip adduction    Hip internal rotation (0-45)    Hip external rotation (0-45)    Knee flexion [facility]  Knee extension    Ankle dorsiflexion    Ankle plantarflexion    Ankle inversion    Ankle  eversion     (*= pain, Blank rows = not tested)  LOWER EXTREMITY MMT:  MMT Right 11/27/21 Left 11/27/21  Hip flexion 4 4  Hip extension 4 4  Hip abduction 4  4  Hip adduction    Hip internal rotation    Hip external rotation    Knee flexion 5 5  Knee extension 4 4  Ankle dorsiflexion 5 5  Ankle plantarflexion    Ankle inversion    Ankle eversion    (*= pain, Blank rows = not tested)   PHYSICAL PERFORMANCE MEASURES:  STS: Gower's sign and inability to control descent   RLE SLS:  LLE SLS: : .59 m/s (limited community ambulator) (12/05/21)  5TSTS: 14 seconds (11/27/21)   TODAY'S TREATMENT:   Therapeutic Exercise: Nustep aerobic machine (seat 6) with use of LE/UE, continued monitoring of RPE, for improved cardiovascular endurance  5 mins: 5/10 RPE, Lvl 1 10 mins- 7/10 RPE, Lvl 1 Seated rest break required after warm-up   Standing marches (hip flexion) with B hand support, 2x10, for improved muscular strength to aid in gait mechanics VCs to decrease pace for proper muscle engagement and balance   Neuromuscular Re-education: Discussion on gait on various surfaces as Pt has a carpeted home and how it can contribute to a loss of balance without an AD.  2 x 29ft of gait with SPC and CGA, VCs for proper technique with SPC in L hand (instead of R) and to increase B stride length and R hip flexion for a more natural gait cycle  Pt demonstrates decreased R arm swing during gait  7/10 RPE after 2nd gait trial with improved R hip flexion to decrease foot drag   1x23ft of gait with RW and CGA, VCs to decrease shoulder elevation during gait  Significant improvement with gait, equal stride length, minimal foot drag, forward gaze noted     Patient response to interventions: End of session, Pt at 8/10 RPE and legs are fatigued.    Patient Education:  Pt provided with added intervention to HEP: added standing marches at counter. Discussion on removing seated hip flexor  stretch from HEP. Patient verbalized understanding and returned demonstration. Patient  educated throughout session on appropriate technique and form using multi-modal cueing, HEP, and activity modification. Patient will benefit from further education in order to maximize compliance and understanding for long-term therapeutic gains.    ASSESSMENT:  Clinical Impression: Patient presents to clinic with excellent motivation to participate in today's session. Pt reports using SPC in the home more regularly this past week. Pt denies pain and numbness/tingling in the LE as well as any episodes of instability. Pt continues to demonstrate deficits in gait, balance, LE muscular strength, ROM, and posture. After multiple gait trials with SPC (in L hand), Pt requiring significant cueing for proper technique for reciprocal gait, increased stride length on the R, and increased hip flexion to avoid foot drag. In addition, Pt's gait speed observed to be impaired with SPC in L hand even with cueing. After discussion, RW used for third bout of gait. Pt's gait significantly improved demonstrating equal stride length, forward gaze, and improved toe off on the R. Pt required moderate cueing to decrease shoulder elevation throughout trial. Pt will significantly benefit from using a RW in the home and DPT reiterated the importance of going to a medical supply store as she has a physician signed note to receive a RW without charge. Pt verbalized understanding. Pt responded well to all other active and educational interventions. Patient will continue to benefit from skilled therapeutic intervention to address deficits in gait, balance, LE muscle strength, ROM, and posture in order to increase PLOF and improve overall QOL.    Objective Impairments: Abnormal gait, decreased activity tolerance, decreased balance, decreased endurance, decreased mobility, difficulty walking, decreased strength, decreased safety awareness, and postural  dysfunction.   Activity Limitations: carrying, sitting, standing, stairs, transfers, continence, and locomotion level  Personal Factors: Age, Past/current experiences, Time since onset of injury/illness/exacerbation, and 1-2 comorbidities: severe lumbar stenosis, ventriculomegaly are also affecting patient's functional outcome.   Rehab Potential: Good  Clinical Decision Making: Evolving/moderate complexity  Evaluation Complexity: Moderate   GOALS: Goals reviewed with patient? Yes  SHORT TERM GOALS: Target date: 12/20/21  Patient will be able to walk >/= 129ft with LRAD and no more than SPV in order to transition towards independence in the home and in the community while minimizing risk of falls.  Baseline: ~127ft with SPC and CGA Goal status: INITIAL   LONG TERM GOALS: Target date: 01/10/22  Patient will be independent in the performance of a HEP in order to improve muscular strength, gait mechanics, and balance.  Baseline: seated hip flexion/toe/heel raises, (6/20) [id number],   Goal status: INITIAL  2.  Patient will be able to improve 5TSTS score to </= 12 seconds in order to match age-predicted norms and reduce risk for falls in the home and community.  Baseline: 14 seconds Goal status: INITIAL  3. Patient will be able to walk IND >/= 158ft with LRAD  in order to demonstrate improved balance and carryover into mobility in the home and in the community. Baseline: unable to walk 165ft due to tolerance, (6/20): ~396ft no AD, CGA Goal status: INITIAL   PLAN: PT Frequency: 2x/week  PT Duration: 6 weeks  Planned Interventions: Therapeutic exercises, Therapeutic activity, Neuromuscular re-education, Balance training, Gait training, Patient/Family education, Joint mobilization, Stair training, Moist heat, Taping, and Manual therapy  Plan For Next Session: warm-up, STS, walking with RW, strengthening LE while working on balance (less use of UE)    Tzivia Oneil, PT,  DPT  12/05/2021, 3:30PM

## 2021-12-26 ENCOUNTER — Ambulatory Visit: Payer: Medicare Other

## 2021-12-29 ENCOUNTER — Ambulatory Visit
Admission: RE | Admit: 2021-12-29 | Discharge: 2021-12-29 | Disposition: A | Discharge status: Home / Self Care | Payer: Medicare Other | Source: Ambulatory Visit | Attending: Neurology | Admitting: Neurology

## 2021-12-29 DIAGNOSIS — R2689 Other abnormalities of gait and mobility: Secondary | ICD-10-CM

## 2022-01-02 ENCOUNTER — Ambulatory Visit: Payer: Medicare Other

## 2022-01-04 ENCOUNTER — Other Ambulatory Visit: Payer: Self-pay

## 2022-01-09 ENCOUNTER — Ambulatory Visit: Payer: Medicare Other

## 2022-01-09 DIAGNOSIS — R293 Abnormal posture: Secondary | ICD-10-CM

## 2022-01-09 DIAGNOSIS — R2689 Other abnormalities of gait and mobility: Secondary | ICD-10-CM

## 2022-01-09 NOTE — Therapy (Incomplete)
OUTPATIENT PHYSICAL THERAPY TREATMENT/RE-CERT   Patient Name: Karen Frank MRN: [id number] DOB:[birth year], 76 y.o., female Today's Date: 01/10/2022   PT End of Session - 01/09/22 1409     Visit Number 5    Number of Visits 15    Date for PT Re-Evaluation 03/21/22    Authorization Type IE: 11/28/21    PT Start Time 1405    PT Stop Time 1440    PT Time Calculation (min) 35 min    Equipment Utilized During Treatment Gait belt    Activity Tolerance Patient tolerated treatment well             History reviewed. No pertinent past medical history. History reviewed. No pertinent surgical history. There are no problems to display for this patient.   PCP: Jordan Likes, MD  REFERRING PROVIDER: Vladimir Crofts, MD  REFERRING DIAG: R26.89 (ICD-10-CM) - Imbalance  THERAPY DIAG:  Impairment of balance  Abnormal posture  Other abnormalities of gait and mobility  Rationale for Evaluation and Treatment Rehabilitation  ONSET DATE: About a year ago                                                                                                                                                                                  PRECAUTIONS: None  WEIGHT BEARING RESTRICTIONS: No  FALLS:  Has patient fallen in last 6 months? No  OCCUPATION/SOCIAL ACTIVITIES: walking outside, activities around the home  PLOF: Independent with gait  PERTINENT HISTORY/CHART REVIEW: Internal medicine appointment 12/11/21 and neurology appointment 03/13/22   OBJECTIVE:   DIAGNOSTIC TESTING/IMPRESSIONS:  IMPRESSION: (06/22/21) 1. Ventriculomegaly, slightly progressed from 06/15/2014. This does not appear significantly disproportionate to the cerebral sulci. This however does not exclude the diagnosis of normal pressure hydrocephalus in the appropriate context. 2. Moderate findings of follow-up chronic small vessel disease as further detailed above. 3. Findings consistent with sequelae of remote  microhemorrhage in the left posterior frontal lobe.  CT Lumbar spine without contrast 05/07/2021: 1. No acute fracture or listhesis. There is chronic-appearing anterolisthesis of L4 on L5. 2. 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   SUBJECTIVE:       Pt has had a death in the family last week and car troubles the week prior. Due to scheduling, Pt has not been able to perform PT 2x/week. Pt has not been able to purchase RW due to financial burden. Pt is using SPC in the home still but also not all the time. Pt has felt that walking has not been terrible but began to have some pain in the L quad area.   PAIN:  Are you having pain? No   OBJECTIVE:  POSTURE: (12/05/21)  In standing, increased R shoulder height, increased rounded shoulders, forward head posture  Posterior pelvic tilt in sitting  Thoracic kyphosis: increased in standing and sitting Iliac crest height: L higher   GAIT: 11/28/21  Distance walked: ~159f Assistive device utilized: Use of SPC on/off  Level of assistance: CGA Comments: decreased step length, downward gaze (more probable due to impaired vision), decreased hip flexion B, poor clearance of B feet (no LOB)    SENSATION: Light touch: Intact at L2-S2 dermatomes   LUMBAR AROM/PROM: Deferred 2/2 time constraints   AROM (Normal range in degrees) AROM   Flexion (65)   Extension (25-30)   Right lateral flexion (25)   Left lateral flexion (25)   Right rotation (30)   Left rotation (30)    (*= pain, Blank rows = not tested)  LOWER EXTREMITY ROM:   PROM (Normal range in degrees) Right 12/05/21 Left 12/05/21  Hip flexion (0-125) In sidelying (restricted) WFL  Hip extension (0-15)    Hip abduction (0-40) [facility] Hip adduction    Hip internal rotation (0-45)    Hip external rotation (0-45)    Knee [facility] Knee extension    Ankle dorsiflexion    Ankle plantarflexion    Ankle inversion    Ankle eversion     (*= pain, Blank rows = not  tested)  LOWER EXTREMITY MMT:  MMT Right 11/27/21 Left 11/27/21  Hip flexion 4 4  Hip extension 4 4  Hip abduction 4  4  Hip adduction    Hip internal rotation    Hip external rotation    Knee flexion 5 5  Knee extension 4 4  Ankle dorsiflexion 5 5  Ankle plantarflexion    Ankle inversion    Ankle eversion    (*= pain, Blank rows = not tested)   PHYSICAL PERFORMANCE MEASURES:  STS: Gower's sign and inability to control descent   RLE SLS:  LLE SLS: 10MWT: .59 m/s (limited community ambulator) (12/05/21)  5TSTS: 14 seconds (11/27/21)   TODAY'S TREATMENT:   Therapeutic Exercise: Review of HEP as Pt has not been seen for 2 weeks:  Pt able to recall toe/heel raises  Pt required cueing for remembering scapular retractions, seated hp marches, standing marching, STSs as well as sets and repetitions  Pt reported having HEP sheets at home  10MWT- with SPC and CGA, .43 m/s (household ambulator)  Neuromuscular Re-education: Re-assessment of STGs and LTGs listed below as Pt is due for re-evaluation  Discussion on using SPC vs no AD especially on days Pt feels more tired as she goes through the day.   Discussion on being aware of bodily compensations during ambulation with no AD vs. SPC   Patient response to interventions: After 301f 5/10 RPE but at end of session Pt reported her legs feeling more tired after multiple bouts of walking from re-eval    Patient Education:  Pt to continue with given HEP from previous sessions.  Patient educated throughout session on appropriate technique and form using multi-modal cueing, HEP, and activity modification. Patient will benefit from further education in order to maximize compliance and understanding for long-term therapeutic gains.    ASSESSMENT:  Clinical Impression: Patient presents to clinic with excellent motivation to participate in today's session.. Marland Kitchenpon re-evaluation, Pt continues to demonstrate deficits in gait, balance,  LE muscular strength, ROM, and posture. Pt continues to use SPC intermittently inside the home and out in the community. Pt unable to purchase RW due  to financial burden, but reports hoping she can afford one in the future if she begins to feel more unstable. Pt met long-term goal of completing 5TSTS in </= 12 seconds (10.5 seconds) which demonstrates improvement in LE strength. Pt is able to walk at least 300ft with SPC and without but continues to require CGA as Pt demonstrates decreased stride length B and decreased toe off on the R. It is observed that LEs begin to fatigue with longer bouts of walking and Pt's RPE can reach up to 7/10 at end of session. New long-term goal (10MWT) established for Pt to be a safe community ambulator (0.4m/s-0.8m/s) with LRAD to increase IND. To continue progression towards goals, patient will continue to benefit from skilled therapeutic intervention to address deficits in gait, balance, LE muscle strength, ROM, and posture in order to increase PLOF and improve overall QOL.    Objective Impairments: Abnormal gait, decreased activity tolerance, decreased balance, decreased endurance, decreased mobility, difficulty walking, decreased strength, decreased safety awareness, and postural dysfunction.   Activity Limitations: carrying, sitting, standing, stairs, transfers, continence, and locomotion level  Personal Factors: Age, Past/current experiences, Time since onset of injury/illness/exacerbation, and 1-2 comorbidities: severe lumbar stenosis, ventriculomegaly are also affecting patient's functional outcome.   Rehab Potential: Good  Clinical Decision Making: Evolving/moderate complexity  Evaluation Complexity: Moderate   GOALS: Goals reviewed with patient? Yes  SHORT TERM GOALS: Target date: 02/07/2022  Patient will be able to walk >/= 100ft with LRAD and no more than SPV in order to transition towards independence in the home and in the community while minimizing  risk of falls.  Baseline: ~100ft with SPC and CGA, (7/26): >100ft with SPC and CGA  Goal status: IN PROGRESS   LONG TERM GOALS: Target date: 03/21/2022  Patient will be independent in the performance of a HEP in order to improve muscular strength, gait mechanics, and balance.  Baseline: seated hip flexion/toe/heel raises, (6/20) [id number], (7/26): continuing with seated toe/heel raises, standing marches at counter, STSs at counter Goal status: IN PROGRESS   2.  Patient will be able to improve 5TSTS score to </= 12 seconds in order to match age-predicted norms and reduce risk for falls in the home and community.  Baseline: 14 seconds, (7/25): 10.5 seconds  Goal status: MET  3. Patient will be able to walk IND >/= 150ft with LRAD  in order to demonstrate improved balance and carryover into mobility in the home and in the community. Baseline: unable to walk 150ft due to tolerance, (6/20): ~300ft no AD, CGA, (7/25): 300ft no AD, CGA  Goal status: IN PROGRESS   4. Patient will be able to improve gait speed on 10MWT to >/= 0.8m/s with LRAD to demonstrate improved LE strength, balance, and mobility in order to safely ambulate in the community without limitation.  Baseline: (7/26): .43m/s Goal status: IN PROGRESS   PLAN: PT Frequency: 1x/week  PT Duration: 10 weeks  Planned Interventions: Therapeutic exercises, Therapeutic activity, Neuromuscular re-education, Balance training, Gait training, Patient/Family education, Joint mobilization, Stair training, Moist heat, Taping, and Manual therapy  Plan For Next Session: walking with SPC, warm-up, step ups, walking   Nichole Montour, PT, DPT  01/10/22, 11:20am 

## 2022-01-16 ENCOUNTER — Ambulatory Visit: Payer: Medicare Other

## 2022-01-18 ENCOUNTER — Ambulatory Visit: Payer: Medicare Other

## 2022-01-25 ENCOUNTER — Ambulatory Visit: Payer: Medicare Other

## 2022-01-31 ENCOUNTER — Ambulatory Visit: Payer: Medicare Other | Attending: Neurology

## 2022-01-31 DIAGNOSIS — R293 Abnormal posture: Secondary | ICD-10-CM | POA: Insufficient documentation

## 2022-01-31 DIAGNOSIS — R2689 Other abnormalities of gait and mobility: Secondary | ICD-10-CM | POA: Diagnosis present

## 2022-01-31 NOTE — Therapy (Addendum)
OUTPATIENT PHYSICAL THERAPY TREATMENT   Patient Name: Karen Frank MRN: [id number] DOB:[birth year], 76 y.o., female Today's Date: 01/31/2022   PT End of Session - 01/31/22 1439     Visit Number 6    Number of Visits 15    Date for PT Re-Evaluation 03/21/22    Authorization Type IE: 11/28/21    PT Start Time 5102    PT Stop Time 1525    PT Time Calculation (min) 40 min    Activity Tolerance Patient tolerated treatment well             History reviewed. No pertinent past medical history. History reviewed. No pertinent surgical history. There are no problems to display for this patient.   PCP: Jordan Likes, MD  REFERRING PROVIDER: Vladimir Crofts, MD  REFERRING DIAG: R26.89 (ICD-10-CM) - Imbalance  THERAPY DIAG:  Impairment of balance  Abnormal posture  Other abnormalities of gait and mobility  Rationale for Evaluation and Treatment Rehabilitation  ONSET DATE: About a year ago                                                                                                                                                                                  PRECAUTIONS: None  WEIGHT BEARING RESTRICTIONS: No  FALLS:  Has patient fallen in last 6 months? No  OCCUPATION/SOCIAL ACTIVITIES: walking outside, activities around the home  PLOF: Independent with gait  PERTINENT HISTORY/CHART REVIEW: Internal medicine appointment 12/11/21 and neurology appointment 03/13/22   OBJECTIVE:   DIAGNOSTIC TESTING/IMPRESSIONS:  IMPRESSION: (06/22/21) 1. Ventriculomegaly, slightly progressed from 06/15/2014. This does not appear significantly disproportionate to the cerebral sulci. This however does not exclude the diagnosis of normal pressure hydrocephalus in the appropriate context. 2. Moderate findings of follow-up chronic small vessel disease as further detailed above. 3. Findings consistent with sequelae of remote microhemorrhage in the left posterior frontal lobe.  CT  Lumbar spine without contrast 05/07/2021: 1. No acute fracture or listhesis. There is chronic-appearing anterolisthesis of L4 on L5. 2. 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   SUBJECTIVE:       Pt has not been seen the past couple weeks. Pt reports she has not been walking well the past few days. Pt feels she needs to use the [facility]. Pt has been performing HEP.   PAIN:  Are you having pain? No   OBJECTIVE:  POSTURE: (12/05/21) In standing, increased R shoulder height, increased rounded shoulders, forward head posture  Posterior pelvic tilt in sitting  Thoracic kyphosis: increased in standing and sitting Iliac crest height: L higher   GAIT: 11/28/21  Distance walked: ~145f Assistive device utilized: Use of SPC on/off  Level of  assistance: CGA Comments: decreased step length, downward gaze (more probable due to impaired vision), decreased hip flexion B, poor clearance of B feet (no LOB)    SENSATION: Light touch: Intact at L2-S2 dermatomes   LUMBAR AROM/PROM: Deferred 2/2 time constraints   AROM (Normal range in degrees) AROM   Flexion (65)   Extension (25-30)   Right lateral flexion (25)   Left lateral flexion (25)   Right rotation (30)   Left rotation (30)    (*= pain, Blank rows = not tested)  LOWER EXTREMITY ROM:   PROM (Normal range in degrees) Right 12/05/21 Left 12/05/21  Hip flexion (0-125) In sidelying (restricted) WFL  Hip extension (0-15)    Hip abduction (0-40) [facility] WFL  Hip adduction    Hip internal rotation (0-45)    Hip external rotation (0-45)    Knee flexion [facility]  Knee extension    Ankle dorsiflexion    Ankle plantarflexion    Ankle inversion    Ankle eversion     (*= pain, Blank rows = not tested)  LOWER EXTREMITY MMT:  MMT Right 11/27/21 Left 11/27/21  Hip flexion 4 4  Hip extension 4 4  Hip abduction 4  4  Hip adduction    Hip internal rotation    Hip external rotation    Knee flexion 5 5  Knee extension 4 4  Ankle dorsiflexion 5 5   Ankle plantarflexion    Ankle inversion    Ankle eversion    (*= pain, Blank rows = not tested)   PHYSICAL PERFORMANCE MEASURES:  STS: Gower's sign and inability to control descent   RLE SLS:  LLE SLS: 10MWT: .59 m/s (limited community ambulator) (12/05/21)  5TSTS: 14 seconds (11/27/21)   TODAY'S TREATMENT:  Neuromuscular Re-education: Gait training:  -300 ft of gait for warm-up with SPC and CGA, cueing required to increase R hip flexion to decrease foot drag, RPE - 5/10  -Weaving in and out with SPC and CGA, x4, with a seated rest break in between, Pt with RPE of 6/10 after activity Cueing for wider turns and to increase hip flexion to decrease foot drag while turning   -Gait (382f) with change in directions for improved reaction time and decrease risk fall risk as well as dual tasking, no AD and CGA Backwards, sidestepping to the L and R, and stopping  Pt did not have LOB, but as Pt fatigued demonstrated criss-cross of L leg over R while sidestepping to the R  RPE: 7/10  Standing step-ups B 2x10, required use of BUE for stability and CGA for improved hip flexion to carryover into gait Pt progressed to SPV with continued use of BUE on railing for stability  Pt asked about practicing above techniques in the home. DPT advised against as Pt lives alone and PT sessions are used to challenge the Pt's balance and gait. Pt has been walking outside for up to 15 mins [facility] DPT [facility] is different outside compared to flat ground. Pt verbalized understanding.   Patient response to interventions: End of session Pt reports fatigue in the legs, RPE 6/10   Patient Education:  Pt to continue with given HEP from previous sessions. Patient educated throughout session on appropriate technique and form using multi-modal cueing, HEP, and activity modification. Patient will benefit from further education in order to maximize compliance and  understanding for long-term therapeutic gains.    ASSESSMENT:  Clinical  Impression: Patient presents to clinic with excellent motivation to participate in today's session. Pt continues to demonstrate deficits in gait, balance, LE muscular strength, ROM, and posture. Pt reports some difficulty with walking over the past 2 weeks mainly due to her LEs feeling weak. No changes to sensation or increased weakness in UE/LE per Pt. Today's session focused on challenging the Pt's gait via turning and working on reaction time and dual-tasking (variation in direction with gait). Pt did not demonstrate a LOB but required CGA with no use of AD after initial use of SPC. After activity, Pt with increased labored breathing and reported LE fatigue with RPE as high as 7/10 during session. Patient will continue to benefit from skilled therapeutic intervention to address deficits in gait, balance, LE muscle strength, ROM, and posture in order to increase PLOF and improve overall QOL.    Objective Impairments: Abnormal gait, decreased activity tolerance, decreased balance, decreased endurance, decreased mobility, difficulty walking, decreased strength, decreased safety awareness, and postural dysfunction.   Activity Limitations: carrying, sitting, standing, stairs, transfers, continence, and locomotion level  Personal Factors: Age, Past/current experiences, Time since onset of injury/illness/exacerbation, and 1-2 comorbidities: severe lumbar stenosis, ventriculomegaly are also affecting patient's functional outcome.   Rehab Potential: Good  Clinical Decision Making: Evolving/moderate complexity  Evaluation Complexity: Moderate   GOALS: Goals reviewed with patient? Yes  SHORT TERM GOALS: Target date: 02/07/2022  Patient will be able to walk >/= 173f with LRAD and no more than SPV in order to transition towards independence in the home and in the community while minimizing risk of falls.  Baseline: ~1059fwith  SPC and CGA, (7/26): >10033fith SPC and CGA  Goal status: IN PROGRESS   LONG TERM GOALS: Target date: 03/21/2022  Patient will be independent in the performance of a HEP in order to improve muscular strength, gait mechanics, and balance.  Baseline: seated hip flexion/toe/heel raises, (6/20) [id number], (7/26): continuing with seated toe/heel raises, standing marches at counter, STSs at counter Goal status: IN PROGRESS   2.  Patient will be able to improve 5TSTS score to </= 12 seconds in order to match age-predicted norms and reduce risk for falls in the home and community.  Baseline: 14 seconds, (7/25): 10.5 seconds  Goal status: MET  3. Patient will be able to walk IND >/= 150f43fth LRAD  in order to demonstrate improved balance and carryover into mobility in the home and in the community. Baseline: unable to walk 150ft30f to tolerance, (6/20): ~300ft 53fD, CGA, (7/25): 300ft n3f, CGA  Goal status: IN PROGRESS   4. Patient will be able to improve gait speed on 10MWT to >/= 0.54m/s [id number] to demonstrate improved LE strength, balance, and mobility in order to safely ambulate in the community without limitation.  Baseline: (7/26): .51m/s Go42mtatus: IN PROGRESS   PLAN: PT Frequency: 1x/week  PT Duration: 10 weeks  Planned Interventions: Therapeutic exercises, Therapeutic activity, Neuromuscular re-education, Balance training, Gait training, Patient/Family education, Joint mobilization, Stair training, Moist heat, Taping, and Manual therapy  Plan For Next Session: warm-up, challenge walking, step ups or continue weaving, were you sore?   Dinna Severs, PT, DPT  01/10/22, 11:20am

## 2022-02-07 ENCOUNTER — Ambulatory Visit: Payer: Medicare Other

## 2022-02-07 DIAGNOSIS — R2689 Other abnormalities of gait and mobility: Secondary | ICD-10-CM

## 2022-02-07 DIAGNOSIS — R293 Abnormal posture: Secondary | ICD-10-CM

## 2022-02-07 NOTE — Therapy (Signed)
OUTPATIENT PHYSICAL THERAPY TREATMENT   Patient Name: Karen Frank MRN: [id number] DOB:[birth year], 76 y.o., female Today's Date: 02/07/2022   PT End of Session - 02/07/22 1441     Visit Number 7    Number of Visits 15    Date for PT Re-Evaluation 03/21/22    Authorization Type IE: 11/28/21    PT Start Time 7588    PT Stop Time 1525    PT Time Calculation (min) 40 min    Activity Tolerance Patient tolerated treatment well             History reviewed. No pertinent past medical history. History reviewed. No pertinent surgical history. There are no problems to display for this patient.   PCP: Jordan Likes, MD  REFERRING PROVIDER: Vladimir Crofts, MD  REFERRING DIAG: R26.89 (ICD-10-CM) - Imbalance  THERAPY DIAG:  Impairment of balance  Abnormal posture  Other abnormalities of gait and mobility  Rationale for Evaluation and Treatment Rehabilitation  ONSET DATE: About a year ago                                                                                                                                                                                  PRECAUTIONS: None  WEIGHT BEARING RESTRICTIONS: No  FALLS:  Has patient fallen in last 6 months? No  OCCUPATION/SOCIAL ACTIVITIES: walking outside, activities around the home  PLOF: Independent with gait  PERTINENT HISTORY/CHART REVIEW: Internal medicine appointment 12/11/21 and neurology appointment 03/13/22   OBJECTIVE:   DIAGNOSTIC TESTING/IMPRESSIONS:  IMPRESSION: (06/22/21) 1. Ventriculomegaly, slightly progressed from 06/15/2014. This does not appear significantly disproportionate to the cerebral sulci. This however does not exclude the diagnosis of normal pressure hydrocephalus in the appropriate context. 2. Moderate findings of follow-up chronic small vessel disease as further detailed above. 3. Findings consistent with sequelae of remote microhemorrhage in the left posterior frontal lobe.  CT  Lumbar spine without contrast 05/07/2021: 1. No acute fracture or listhesis. There is chronic-appearing anterolisthesis of L4 on L5. 2. 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   SUBJECTIVE:       Pt felt a little sore after last session for a couple days and then it went away.   PAIN:  Are you having pain? No   OBJECTIVE:  POSTURE: (12/05/21) In standing, increased R shoulder height, increased rounded shoulders, forward head posture  Posterior pelvic tilt in sitting  Thoracic kyphosis: increased in standing and sitting Iliac crest height: L higher   GAIT: 11/28/21  Distance walked: ~194f Assistive device utilized: Use of SPC on/off  Level of assistance: CGA Comments: decreased step length, downward gaze (more probable due to impaired vision), decreased hip flexion  B, poor clearance of B feet (no LOB)    SENSATION: Light touch: Intact at L2-S2 dermatomes   LUMBAR AROM/PROM: Deferred 2/2 time constraints   AROM (Normal range in degrees) AROM   Flexion (65)   Extension (25-30)   Right lateral flexion (25)   Left lateral flexion (25)   Right rotation (30)   Left rotation (30)    (*= pain, Blank rows = not tested)  LOWER EXTREMITY ROM:   PROM (Normal range in degrees) Right 12/05/21 Left 12/05/21  Hip flexion (0-125) In sidelying (restricted) WFL  Hip extension (0-15)    Hip abduction (0-40) [facility] WFL  Hip adduction    Hip internal rotation (0-45)    Hip external rotation (0-45)    Knee flexion [facility]  Knee extension    Ankle dorsiflexion    Ankle plantarflexion    Ankle inversion    Ankle eversion     (*= pain, Blank rows = not tested)  LOWER EXTREMITY MMT:  MMT Right 11/27/21 Left 11/27/21  Hip flexion 4 4  Hip extension 4 4  Hip abduction 4  4  Hip adduction    Hip internal rotation    Hip external rotation    Knee flexion 5 5  Knee extension 4 4  Ankle dorsiflexion 5 5  Ankle plantarflexion    Ankle inversion    Ankle eversion    (*= pain, Blank  rows = not tested)   PHYSICAL PERFORMANCE MEASURES:  STS: Gower's sign and inability to control descent   RLE SLS:  LLE SLS: 10MWT: .59 m/s (limited community ambulator) (12/05/21)  5TSTS: 14 seconds (11/27/21)   TODAY'S TREATMENT:  Therapeutic Exercise: Seated hip flexion with 2# ankle weights for improved LE strengthening, 2x10 B  Seated rest break in between sets  Seated hip abduction with YTB, 2x10 for improved LE strengthening, VCs and TCs for proper technique  Seated rest break in between sets  Neuromuscular Re-education: Gait training:  -300 ft of gait for warm-up with no AD and variation between SPV and CGA, cueing required to increase R hip flexion to decrease foot drag, RPE - 8/10  Seated 5 min rest break required with cueing for diaphragmatic breathing  Returned to a 4/10   Standing step-ups B 2x10, required use of UEfor stability and SPV  for improved hip flexion to carryover into gait Pt challenged with using single UE on bar, no LOB    Patient response to interventions: End of session Pt reports fatigue in the legs, RPE 6/10   Patient Education:  Pt provided with HEP: Seated hip abduction with YTB. Patient educated throughout session on appropriate technique and form using multi-modal cueing, HEP, and activity modification. Patient will benefit from further education in order to maximize compliance and understanding for long-term therapeutic gains.    ASSESSMENT:  Clinical Impression: Patient presents to clinic with excellent motivation to participate in today's session. Pt continues to demonstrate deficits in gait, balance, LE muscular strength, ROM, and posture. Pt reports walking outside this past week with SPC and no LOB. Pt feels her LE fatigues quickly. DPT assured Pt that treatments are focusing on building strength and endurance and the time it takes for strength to build (6-8 weeks). Pt verbalized understanding. Pt continues to require CGA without AD  during gait training and cueing to increase hip flexion especially during turns. Pt observed and reports more fatigue after warm-up, 8/10 RPE. After seated rest breaks and cueing for diaphragmatic breathing, Pt able  to return to a 4/10. Session focused on LE strength in seated and standing with SPV as SPC was not used. Pt ended session with 6/10 RPE. Patient will continue to benefit from skilled therapeutic intervention to address deficits in gait, balance, LE muscle strength, ROM, and posture in order to increase PLOF and improve overall QOL.    Objective Impairments: Abnormal gait, decreased activity tolerance, decreased balance, decreased endurance, decreased mobility, difficulty walking, decreased strength, decreased safety awareness, and postural dysfunction.   Activity Limitations: carrying, sitting, standing, stairs, transfers, continence, and locomotion level  Personal Factors: Age, Past/current experiences, Time since onset of injury/illness/exacerbation, and 1-2 comorbidities: severe lumbar stenosis, ventriculomegaly are also affecting patient's functional outcome.   Rehab Potential: Good  Clinical Decision Making: Evolving/moderate complexity  Evaluation Complexity: Moderate   GOALS: Goals reviewed with patient? Yes  SHORT TERM GOALS: Target date: 03/02/2022  Patient will be able to walk >/= 12f with LRAD and no more than SPV in order to transition towards independence in the home and in the community while minimizing risk of falls.  Baseline: ~1014fwith SPC and CGA, (7/26): >10057fith SPC and CGA; (8/23): >100 ft with no AD and CGA Goal status: IN PROGRESS   LONG TERM GOALS: Target date: 03/21/2022  Patient will be independent in the performance of a HEP in order to improve muscular strength, gait mechanics, and balance.  Baseline: seated hip flexion/toe/heel raises, (6/20) [id number], (7/26): continuing with seated toe/heel raises, standing marches at counter, STSs at  counter Goal status: IN PROGRESS   2.  Patient will be able to improve 5TSTS score to </= 12 seconds in order to match age-predicted norms and reduce risk for falls in the home and community.  Baseline: 14 seconds, (7/25): 10.5 seconds  Goal status: MET  3. Patient will be able to walk IND >/= 150f85fth LRAD  in order to demonstrate improved balance and carryover into mobility in the home and in the community. Baseline: unable to walk 150ft3f to tolerance, (6/20): ~300ft 76fD, CGA, (7/25): 300ft n7f, CGA  Goal status: IN PROGRESS   4. Patient will be able to improve gait speed on 10MWT to >/= 0.58m/s [id number] to demonstrate improved LE strength, balance, and mobility in order to safely ambulate in the community without limitation.  Baseline: (7/26): .40m/s [id number]: IN PROGRESS   PLAN: PT Frequency: 1x/week  PT Duration: 10 weeks  Planned Interventions: Therapeutic exercises, Therapeutic activity, Neuromuscular re-education, Balance training, Gait training, Patient/Family education, Joint mobilization, Stair training, Moist heat, Taping, and Manual therapy  Plan For Next Session: warm-up, challenge walking, continue weaving, were you sore?   Ambre Kobayashi, PT, DPT 02/07/22, 2:43 pm

## 2022-02-12 ENCOUNTER — Ambulatory Visit: Payer: Medicare Other

## 2022-02-12 DIAGNOSIS — R2689 Other abnormalities of gait and mobility: Secondary | ICD-10-CM | POA: Diagnosis not present

## 2022-02-12 DIAGNOSIS — R293 Abnormal posture: Secondary | ICD-10-CM

## 2022-02-12 NOTE — Therapy (Signed)
OUTPATIENT PHYSICAL THERAPY TREATMENT   Patient Name: Karen Frank MRN: [id number] DOB:[birth year], 76 y.o., female Today's Date: 02/12/2022   PT End of Session - 02/12/22 1348     Visit Number 8    Number of Visits 15    Date for PT Re-Evaluation 03/21/22    Authorization Type IE: 11/28/21    PT Start Time 1350    PT Stop Time 1430    PT Time Calculation (min) 40 min    Equipment Utilized During Treatment Gait belt    Activity Tolerance Patient tolerated treatment well             History reviewed. No pertinent past medical history. History reviewed. No pertinent surgical history. There are no problems to display for this patient.   PCP: Jordan Likes, MD  REFERRING PROVIDER: Vladimir Crofts, MD  REFERRING DIAG: R26.89 (ICD-10-CM) - Imbalance  THERAPY DIAG:  Impairment of balance  Abnormal posture  Other abnormalities of gait and mobility  Rationale for Evaluation and Treatment Rehabilitation  ONSET DATE: About a year ago                                                                                                                                                                                  PRECAUTIONS: None  WEIGHT BEARING RESTRICTIONS: No  FALLS:  Has patient fallen in last 6 months? No  OCCUPATION/SOCIAL ACTIVITIES: walking outside, activities around the home  PLOF: Independent with gait  PERTINENT HISTORY/CHART REVIEW: Internal medicine appointment 12/11/21 and neurology appointment 03/13/22   OBJECTIVE:   DIAGNOSTIC TESTING/IMPRESSIONS:  IMPRESSION: (06/22/21) 1. Ventriculomegaly, slightly progressed from 06/15/2014. This does not appear significantly disproportionate to the cerebral sulci. This however does not exclude the diagnosis of normal pressure hydrocephalus in the appropriate context. 2. Moderate findings of follow-up chronic small vessel disease as further detailed above. 3. Findings consistent with sequelae of remote  microhemorrhage in the left posterior frontal lobe.  CT Lumbar spine without contrast 05/07/2021: 1. No acute fracture or listhesis. There is chronic-appearing anterolisthesis of L4 on L5. 2. 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   SUBJECTIVE:       Pt did not feel as sore since last session and Pt was able to go further distant walking outside (1 house further).   PAIN:  Are you having pain? No   OBJECTIVE:  POSTURE: (12/05/21) In standing, increased R shoulder height, increased rounded shoulders, forward head posture  Posterior pelvic tilt in sitting  Thoracic kyphosis: increased in standing and sitting Iliac crest height: L higher   GAIT: 11/28/21  Distance walked: ~179f Assistive device utilized: Use of SPC on/off  Level of assistance: CGA Comments:  decreased step length, downward gaze (more probable due to impaired vision), decreased hip flexion B, poor clearance of B feet (no LOB)    SENSATION: Light touch: Intact at L2-S2 dermatomes   LUMBAR AROM/PROM: Deferred 2/2 time constraints   AROM (Normal range in degrees) AROM   Flexion (65)   Extension (25-30)   Right lateral flexion (25)   Left lateral flexion (25)   Right rotation (30)   Left rotation (30)    (*= pain, Blank rows = not tested)  LOWER EXTREMITY ROM:   PROM (Normal range in degrees) Right 12/05/21 Left 12/05/21  Hip flexion (0-125) In sidelying (restricted) WFL  Hip extension (0-15)    Hip abduction (0-40) [facility]  Hip adduction    Hip internal rotation (0-45)    Hip external rotation (0-45)    Knee flexion [facility]  Knee extension    Ankle dorsiflexion    Ankle plantarflexion    Ankle inversion    Ankle eversion     (*= pain, Blank rows = not tested)  LOWER EXTREMITY MMT:  MMT Right 11/27/21 Left 11/27/21  Hip flexion 4 4  Hip extension 4 4  Hip abduction 4  4  Hip adduction    Hip internal rotation    Hip external rotation    Knee flexion 5 5  Knee extension 4 4  Ankle  dorsiflexion 5 5  Ankle plantarflexion    Ankle inversion    Ankle eversion    (*= pain, Blank rows = not tested)   PHYSICAL PERFORMANCE MEASURES:  STS: Gower's sign and inability to control descent   RLE SLS:  LLE SLS: 10MWT: .59 m/s (limited community ambulator) (12/05/21)  5TSTS: 14 seconds (11/27/21)   TODAY'S TREATMENT  Neuromuscular Re-education: Gait training:  -400 ft of gait for warm-up with no AD and SPV, cueing required to increase R hip flexion to decrease foot drag, RPE - 8/10  Seated 5 min rest break required with cueing for diaphragmatic breathing  Returned to a 4/10   Discussion on RPE scale and using that in the home to understand when to take seated rest breaks and decrease risk for falls or adverse events.   Seated diaphragmatic breathing to control exertion when above 7/10 and downregulation of cortisol levels increased with activity. Pt verbalized and demonstrated understanding.   Gait obstacle x2 emphasis with turns and stepping over (hip flexion) over ankle weights. Pt with increased stance time before stepping over  6/10 RPE after 2nd round   Gait (362f) with change in directions for improved reaction time and decrease risk fall risk as well as dual tasking, no AD and CGA Backwards, forward, stopping, walk fast and walk slow Pt had 1 LOB with backwards but able to demonstrate a step response RPE: 8/10 at end of activity    Patient response to interventions: End of session Pt reports fatigue in the legs, 8/10 RPE    Patient Education:  Pt provided with HEP: no change. Patient educated throughout session on appropriate technique and form using multi-modal cueing, HEP, and activity modification. Patient will benefit from further education in order to maximize compliance and understanding for long-term therapeutic gains.    ASSESSMENT:  Clinical Impression: Patient presents to clinic with excellent motivation to participate in today's session. Pt  continues to demonstrate deficits in gait, balance, LE muscular strength, ROM, and posture. Pt reports minimal soreness after last week's session compared to the previous. Discussion on the importance of having supportive (tied)  shoes with walking as loose shoes increases risk for falls. DPT talked about slip-on shoes or shoes with velcro straps if Pt finds it difficult to tie shoes. Pt verbalized understanding. Pt able to ambulate ~467f on flat surface with SPV; however cueing required as Pt fatigued to increase R hip flexion. With increased gait challenges/obstacles, Pt required CGA with no AD and cueing to maintain R hip flexion and increased/equal stride length. With last gait obstacle, Pt did have LOB but able to demonstrate step response with CGA. Discussed the importance of balance reaction responses. Pt ended session with 8/10 RPE after gait obstacles. Pt responded well to active and educational interventions. Patient will continue to benefit from skilled therapeutic intervention to address deficits in gait, balance, LE muscle strength, ROM, and posture in order to increase PLOF and improve overall QOL.    Objective Impairments: Abnormal gait, decreased activity tolerance, decreased balance, decreased endurance, decreased mobility, difficulty walking, decreased strength, decreased safety awareness, and postural dysfunction.   Activity Limitations: carrying, sitting, standing, stairs, transfers, continence, and locomotion level  Personal Factors: Age, Past/current experiences, Time since onset of injury/illness/exacerbation, and 1-2 comorbidities: severe lumbar stenosis, ventriculomegaly are also affecting patient's functional outcome.   Rehab Potential: Good  Clinical Decision Making: Evolving/moderate complexity  Evaluation Complexity: Moderate   GOALS: Goals reviewed with patient? Yes  SHORT TERM GOALS: Target date: 03/02/2022  Patient will be able to walk >/= 1086fwith LRAD and no  more than SPV in order to transition towards independence in the home and in the community while minimizing risk of falls.  Baseline: ~10027fith SPC and CGA, (7/26): >100f82fth SPC and CGA; (8/23): >100 ft with no AD and CGA Goal status: IN PROGRESS   LONG TERM GOALS: Target date: 03/21/2022  Patient will be independent in the performance of a HEP in order to improve muscular strength, gait mechanics, and balance.  Baseline: seated hip flexion/toe/heel raises, (6/20) [id number], (7/26): continuing with seated toe/heel raises, standing marches at counter, STSs at counter Goal status: IN PROGRESS   2.  Patient will be able to improve 5TSTS score to </= 12 seconds in order to match age-predicted norms and reduce risk for falls in the home and community.  Baseline: 14 seconds, (7/25): 10.5 seconds  Goal status: MET  3. Patient will be able to walk IND >/= 150ft32fh LRAD  in order to demonstrate improved balance and carryover into mobility in the home and in the community. Baseline: unable to walk 150ft 79fto tolerance, (6/20): ~300ft n27f, CGA, (7/25): 300ft no73f CGA  Goal status: IN PROGRESS   4. Patient will be able to improve gait speed on 10MWT to >/= 0.70m/s [id number] to demonstrate improved LE strength, balance, and mobility in order to safely ambulate in the community without limitation.  Baseline: (7/26): .70m/s [id number]: IN PROGRESS   PLAN: PT Frequency: 1x/week  PT Duration: 10 weeks  Planned Interventions: Therapeutic exercises, Therapeutic activity, Neuromuscular re-education, Balance training, Gait training, Patient/Family education, Joint mobilization, Stair training, Moist heat, Taping, and Manual therapy  Plan For Next Session: UE seated posture, warm-up, challenge gait, continue weaving   Daliana Leverett, PT, DPT  02/12/22, 1:50 pm

## 2022-02-21 ENCOUNTER — Ambulatory Visit: Payer: Medicare Other | Attending: Neurology

## 2022-02-21 DIAGNOSIS — R293 Abnormal posture: Secondary | ICD-10-CM | POA: Insufficient documentation

## 2022-02-21 DIAGNOSIS — R2689 Other abnormalities of gait and mobility: Secondary | ICD-10-CM | POA: Diagnosis present

## 2022-02-21 NOTE — Therapy (Signed)
 OUTPATIENT PHYSICAL THERAPY TREATMENT   Patient Name: Karen Frank MRN: [id number] DOB:04/28/1946, 76 y.o., female Today's Date: 02/21/2022   PT End of Session - 02/21/22 1444     Visit Number 9    Number of Visits 15    Date for PT Re-Evaluation 03/21/22    Authorization Type IE: 11/28/21    PT Start Time 1445    PT Stop Time 1525    PT Time Calculation (min) 40 min    Equipment Utilized During Treatment Gait belt    Activity Tolerance Patient tolerated treatment well             History reviewed. No pertinent past medical history. History reviewed. No pertinent surgical history. There are no problems to display for this patient.   PCP: David Halpern, MD  REFERRING PROVIDER: Shah, Hemang K, MD  REFERRING DIAG: R26.89 (ICD-10-CM) - Imbalance  THERAPY DIAG:  Impairment of balance  Abnormal posture  Other abnormalities of gait and mobility  Rationale for Evaluation and Treatment Rehabilitation  ONSET DATE: About a year ago                                                                                                                                                                                  PRECAUTIONS: None  WEIGHT BEARING RESTRICTIONS: No  FALLS:  Has patient fallen in last 6 months? No  OCCUPATION/SOCIAL ACTIVITIES: walking outside, activities around the home  PLOF: Independent with gait  PERTINENT HISTORY/CHART REVIEW: Internal medicine appointment 12/11/21 and neurology appointment 03/13/22   OBJECTIVE:   DIAGNOSTIC TESTING/IMPRESSIONS:  IMPRESSION: (06/22/21) 1. Ventriculomegaly, slightly progressed from 06/15/2014. This does not appear significantly disproportionate to the cerebral sulci. This however does not exclude the diagnosis of normal pressure hydrocephalus in the appropriate context. 2. Moderate findings of follow-up chronic small vessel disease as further detailed above. 3. Findings consistent with sequelae of remote  microhemorrhage in the left posterior frontal lobe.  CT Lumbar spine without contrast 05/07/2021: 1. No acute fracture or listhesis. There is chronic-appearing anterolisthesis of L4 on L5. 2. 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   SUBJECTIVE:       Pt recently lost air conditioning in her home and feels fatigued from heat.   PAIN:  Are you having pain? No   OBJECTIVE:  POSTURE: (12/05/21) In standing, increased R shoulder height, increased rounded shoulders, forward head posture  Posterior pelvic tilt in sitting  Thoracic kyphosis: increased in standing and sitting Iliac crest height: L higher   GAIT: 11/28/21  Distance walked: ~100ft Assistive device utilized: Use of SPC on/off  Level of assistance: CGA Comments: decreased step length, downward gaze (more probable due to   impaired vision), decreased hip flexion B, poor clearance of B feet (no LOB)    SENSATION: Light touch: Intact at L2-S2 dermatomes   LUMBAR AROM/PROM: Deferred 2/2 time constraints   AROM (Normal range in degrees) AROM   Flexion (65)   Extension (25-30)   Right lateral flexion (25)   Left lateral flexion (25)   Right rotation (30)   Left rotation (30)    (*= pain, Blank rows = not tested)  LOWER EXTREMITY ROM:   PROM (Normal range in degrees) Right 12/05/21 Left 12/05/21  Hip flexion (0-125) In sidelying (restricted) WFL  Hip extension (0-15)    Hip abduction (0-40) [facility] WFL  Hip adduction    Hip internal rotation (0-45)    Hip external rotation (0-45)    Knee flexion [facility]  Knee extension    Ankle dorsiflexion    Ankle plantarflexion    Ankle inversion    Ankle eversion     (*= pain, Blank rows = not tested)  LOWER EXTREMITY MMT:  MMT Right 11/27/21 Left 11/27/21  Hip flexion 4 4  Hip extension 4 4  Hip abduction 4  4  Hip adduction    Hip internal rotation    Hip external rotation    Knee flexion 5 5  Knee extension 4 4  Ankle dorsiflexion 5 5  Ankle plantarflexion     Ankle inversion    Ankle eversion    (*= pain, Blank rows = not tested)   PHYSICAL PERFORMANCE MEASURES:  STS: Gower's sign and inability to control descent   RLE SLS:  LLE SLS: 10MWT: .59 m/s (limited community ambulator) (12/05/21)  5TSTS: 14 seconds (11/27/21)   TODAY'S TREATMENT  Neuromuscular Re-education: Gait training:  -350 ft of gait for warm-up with no AD and SPV, cueing required to increase R hip flexion to decrease foot drag, RPE - 7/10  Seated 5 min rest break required with cueing for diaphragmatic breathing  Returned to a 4/10   Seated diaphragmatic breathing for improved downregulation of nervous system after warm-up   Therapeutic Exercise: Seated rows with RTB, 2x10, for improved posterior chain strengthening to aid in posture while ambulating - cueing to breath  Seated lat pull downs with RTB, 2x10, for posterior chain strengthening to aid in posture while ambulating - cueing to breath  Seated chin tucks for posterior chain strengthening to decrease forward head posture during ambulation, 2x10   Seated upper trap stretch for improved tissue extensibility and pain modulation   Patient response to interventions: End of session, LE not fatigued but focused more on UE.    Patient Education:  Pt provided with HEP: all UE posterior chain, upper trap stretch. Patient educated throughout session on appropriate technique and form using multi-modal cueing, HEP, and activity modification. Patient will benefit from further education in order to maximize compliance and understanding for long-term therapeutic gains.    ASSESSMENT:  Clinical Impression: Patient presents to clinic with excellent motivation to participate in today's session. Pt continues to demonstrate deficits in gait, balance, LE muscular strength, ROM, and posture. Pt reports gait has felt better but has felt fatigued recently due to no air conditioning in home. Pt able to perform ambulation without  AD and SPV, but requiring cueing to increase R hip flexion. Session focused on UE strengthening with resistance (RTB) in order to aid in posture which ultimately carries over into gait. Pt required moderate VCs and TCs to decrease bodily compensations (elevated shoulder height) and breathing with movement.  Pt reported increased pain when performing seated upper trap stretch but DPT modified movement and pain decreased with increased time. Pt responded well to active and educational interventions. Patient will continue to benefit from skilled therapeutic intervention to address deficits in gait, balance, LE muscle strength, ROM, and posture in order to increase PLOF and improve overall QOL.    Objective Impairments: Abnormal gait, decreased activity tolerance, decreased balance, decreased endurance, decreased mobility, difficulty walking, decreased strength, decreased safety awareness, and postural dysfunction.   Activity Limitations: carrying, sitting, standing, stairs, transfers, continence, and locomotion level  Personal Factors: Age, Past/current experiences, Time since onset of injury/illness/exacerbation, and 1-2 comorbidities: severe lumbar stenosis, ventriculomegaly are also affecting patient's functional outcome.   Rehab Potential: Good  Clinical Decision Making: Evolving/moderate complexity  Evaluation Complexity: Moderate   GOALS: Goals reviewed with patient? Yes  SHORT TERM GOALS: Target date: 03/02/2022  Patient will be able to walk >/= 164f with LRAD and no more than SPV in order to transition towards independence in the home and in the community while minimizing risk of falls.  Baseline: ~1065fwith SPC and CGA, (7/26): >10062fith SPC and CGA; (8/23): >100 ft with no AD and CGA Goal status: IN PROGRESS   LONG TERM GOALS: Target date: 03/21/2022  Patient will be independent in the performance of a HEP in order to improve muscular strength, gait mechanics, and balance.   Baseline: seated hip flexion/toe/heel raises, (6/20) [id number], (7/26): continuing with seated toe/heel raises, standing marches at counter, STSs at counter Goal status: IN PROGRESS   2.  Patient will be able to improve 5TSTS score to </= 12 seconds in order to match age-predicted norms and reduce risk for falls in the home and community.  Baseline: 14 seconds, (7/25): 10.5 seconds  Goal status: MET  3. Patient will be able to walk IND >/= 150f53fth LRAD  in order to demonstrate improved balance and carryover into mobility in the home and in the community. Baseline: unable to walk 150ft67f to tolerance, (6/20): ~300ft 24fD, CGA, (7/25): 300ft n45f, CGA  Goal status: IN PROGRESS   4. Patient will be able to improve gait speed on 10MWT to >/= 0.48m/s [id number] to demonstrate improved LE strength, balance, and mobility in order to safely ambulate in the community without limitation.  Baseline: (7/26): .11m/s Go66mtatus: IN PROGRESS   PLAN: PT Frequency: 1x/week  PT Duration: 10 weeks  Planned Interventions: Therapeutic exercises, Therapeutic activity, Neuromuscular re-education, Balance training, Gait training, Patient/Family education, Joint mobilization, Stair training, Moist heat, Taping, and Manual therapy  Plan For Next Session:  warm-up, STSs, challenge gait, continue weaving   Ritta Hammes [employer]  02/21/22, 2:45pm

## 2022-02-28 ENCOUNTER — Ambulatory Visit: Payer: Medicare Other

## 2022-02-28 DIAGNOSIS — R293 Abnormal posture: Secondary | ICD-10-CM

## 2022-02-28 DIAGNOSIS — R2689 Other abnormalities of gait and mobility: Secondary | ICD-10-CM

## 2022-02-28 NOTE — Therapy (Signed)
OUTPATIENT PHYSICAL THERAPY PROGRESS NOTE/RE-CERT  2/63/33 - 5/45/62   Patient Name: Karen Frank MRN: [id number] DOB:06/18/1946, 76 y.o., female Today's Date: 03/01/2022   PT End of Session - 02/28/22 1440     Visit Number 10    Number of Visits 22   Date for PT Re-Evaluation 04/25/22    Authorization Type IE: 11/28/21, PN/RC: 02/28/22    PT Start Time 1442    PT Stop Time 1522    PT Time Calculation (min) 40 min    Equipment Utilized During Treatment Gait belt    Activity Tolerance Patient tolerated treatment well             No past medical history on file. No past surgical history on file. There are no problems to display for this patient.   PCP: Jordan Likes, MD  REFERRING PROVIDER: Vladimir Crofts, MD  REFERRING DIAG: R26.89 (ICD-10-CM) - Imbalance  THERAPY DIAG:  Impairment of balance  Abnormal posture  Other abnormalities of gait and mobility  Rationale for Evaluation and Treatment Rehabilitation  ONSET DATE: About a year ago                                                                                                                                                                                  PRECAUTIONS: None  WEIGHT BEARING RESTRICTIONS: No  FALLS:  Has patient fallen in last 6 months? No  OCCUPATION/SOCIAL ACTIVITIES: walking outside, activities around the home  PLOF: Independent with gait  PERTINENT HISTORY/CHART REVIEW: Internal medicine appointment 12/11/21 and neurology appointment 03/13/22   OBJECTIVE:   DIAGNOSTIC TESTING/IMPRESSIONS:  IMPRESSION: (06/22/21) 1. Ventriculomegaly, slightly progressed from 06/15/2014. This does not appear significantly disproportionate to the cerebral sulci. This however does not exclude the diagnosis of normal pressure hydrocephalus in the appropriate context. 2. Moderate findings of follow-up chronic small vessel disease as further detailed above. 3. Findings consistent with sequelae of  remote microhemorrhage in the left posterior frontal lobe.  CT Lumbar spine without contrast 05/07/2021: 1. No acute fracture or listhesis. There is chronic-appearing anterolisthesis of L4 on L5. 2. 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   SUBJECTIVE:       Pt was able to fix her AC. Pt felt like she was lightheaded after standing and completing some HEP this past week. After some discussion, Pt was doing HEP one day before her AC was fixed then went to stand and felt dizzy. The dizziness went away but discussion on heat intolerance and fatigue.    PAIN:  Are you having pain? No   OBJECTIVE:  POSTURE: (12/05/21) In standing, increased R shoulder height, increased rounded shoulders, forward head posture  Posterior  pelvic tilt in sitting  Thoracic kyphosis: increased in standing and sitting Iliac crest height: L higher   GAIT: 11/28/21  Distance walked: ~140ft Assistive device utilized: Use of SPC on/off  Level of assistance: CGA Comments: decreased step length, downward gaze (more probable due to impaired vision), decreased hip flexion B, poor clearance of B feet (no LOB)    SENSATION: Light touch: Intact at L2-S2 dermatomes   LUMBAR AROM/PROM: Deferred 2/2 time constraints   AROM (Normal range in degrees) AROM   Flexion (65)   Extension (25-30)   Right lateral flexion (25)   Left lateral flexion (25)   Right rotation (30)   Left rotation (30)    (*= pain, Blank rows = not tested)  LOWER EXTREMITY ROM:   PROM (Normal range in degrees) Right 12/05/21 Left 12/05/21  Hip flexion (0-125) In sidelying (restricted) WFL  Hip extension (0-15)    Hip abduction (0-40) [facility]  Hip adduction    Hip internal rotation (0-45)    Hip external rotation (0-45)    Knee flexion [facility]  Knee extension    Ankle dorsiflexion    Ankle plantarflexion    Ankle inversion    Ankle eversion     (*= pain, Blank rows = not tested)  LOWER EXTREMITY MMT:  MMT Right 11/27/21  Left 11/27/21 Left 02/28/22 Right 02/28/22  Hip flexion $RemoveBef'4 4 5 5  'mvGEjLICCk$ Hip extension $RemoveBefor'4 4 4 4  'QWjendkCfrmu$ Hip abduction 4  4    Hip adduction      Hip internal rotation      Hip external rotation      Knee flexion $RemoveBefo'5 5 4 4  'SVcOHJMNarV$ Knee extension $RemoveBefore'4 4 4 5  'sGheiCHpPEeAl$ Ankle dorsiflexion (in seated) $RemoveBef'5 5 5 5  'LcaYONaFzd$ Ankle plantarflexion      Ankle inversion      Ankle eversion      (*= pain, Blank rows = not tested)   PHYSICAL PERFORMANCE MEASURES:  STS: Gower's sign and inability to control descent   RLE SLS:  LLE SLS: 10MWT: .59 m/s (limited community ambulator) (12/05/21)  5TSTS: 14 seconds (11/27/21)   TODAY'S TREATMENT  Neuromuscular Re-education: Progress note/Re-assessment-  Reviewed LTGs and STGs  MMT strength tested Pt able to perform STSs without Gower's sign  Testing of 10MWT: see results below -tested with and without AD and SPV   Therapeutic Exercise: Supine straight leg raise and supine B leg abduction for improved strengthening   Standing hip extension at counter, B x5, for improved strengthening   STSs, x3 with arms across chest for increased challenge for improved LE strengthening  Cueing for controlled descent  Patient response to interventions: Pt felt fatigue in LEs after strengthening exercises   Patient Education:  Pt provided with HEP: supine straight leg raise and supine abduction, standing hip ext. Patient educated throughout session on appropriate technique and form using multi-modal cueing, HEP, and activity modification. Patient will benefit from further education in order to maximize compliance and understanding for long-term therapeutic gains.    ASSESSMENT:  Clinical Impression: Patient presents to clinic with excellent motivation to participate in today's session. Upon physical reassessment, Pt demonstrates moderate improvement from IE on 11/28/21. In relation to gait, Pt demonstrates improved ability to gaze forward and have reciprocal UE movement with LE without SPC and only  requiring SPV. Pt continues to use SPC when LEs become fatigued or out in the community for safety purposes. On the 10MWT, Pt scored a .54m/s compared to .30m/s on  01/10/22 without use of AD and SPV. Pt continues to require SPV and cueing for increased hip flexion particularly on the RLE in order to avoid foot drag. In addition, Pt also requires cueing for equal stride length but reports LEs fatigue quickly. Hip flexion MMT has improved since IE, 5/5 bilaterally. Pt also minimally demonstrates Gower's sign with STSs. Although improvement has been demonstrated, Pt will continue to benefit from benefit from skilled therapeutic intervention to address deficits in gait, balance, LE muscle strength, ROM, endurance, and posture in order to increase independence in the home and in the community as well as improve overall QOL.    Objective Impairments: Abnormal gait, decreased activity tolerance, decreased balance, decreased endurance, decreased mobility, difficulty walking, decreased strength, decreased safety awareness, and postural dysfunction.   Activity Limitations: carrying, sitting, standing, stairs, transfers, continence, and locomotion level  Personal Factors: Age, Past/current experiences, Time since onset of injury/illness/exacerbation, and 1-2 comorbidities: severe lumbar stenosis, ventriculomegaly are also affecting patient's functional outcome.   Rehab Potential: Good  Clinical Decision Making: Evolving/moderate complexity  Evaluation Complexity: Moderate   GOALS: Goals reviewed with patient? Yes  SHORT TERM GOALS: Target date: 03/02/2022  Patient will be able to walk >/= 175ft with LRAD and no more than SPV in order to transition towards independence in the home and in the community while minimizing risk of falls.  Baseline: ~133ft with SPC and CGA, (7/26): >111ft with SPC and CGA; (8/23): >100 ft with no AD and CGA; (9/13): >169ft with SPC and without SPC, SPV Goal status: MET   LONG  TERM GOALS: Target date: 04/25/2022  Patient will be independent in the performance of a HEP in order to improve muscular strength, gait mechanics, and balance.  Baseline: seated hip flexion/toe/heel raises, (6/20) [id number], (7/26): continuing with seated toe/heel raises, standing marches at counter, STSs at counter Goal status: IN PROGRESS   2.  Patient will be able to improve 5TSTS score to </= 12 seconds in order to match age-predicted norms and reduce risk for falls in the home and community.  Baseline: 14 seconds, (7/25): 10.5 seconds  Goal status: MET  3. Patient will be able to walk IND >/= 152ft with LRAD  in order to demonstrate improved balance and carryover into mobility in the home and in the community. Baseline: unable to walk 157ft due to tolerance, (6/20): ~329ft no AD, CGA, (7/25): 381ft no AD, CGA  Goal status: IN PROGRESS   4. Patient will be able to improve gait speed on 10MWT to >/= 0.21m/s with LRAD to demonstrate improved LE strength, balance, and mobility in order to safely ambulate in the community without limitation.  Baseline: (7/26): .47m/s; (9/13): .64m/s  Goal status: IN PROGRESS   PLAN: PT Frequency: 1x/week  PT Duration: 10 weeks  Planned Interventions: Therapeutic exercises, Therapeutic activity, Neuromuscular re-education, Balance training, Gait training, Patient/Family education, Joint mobilization, Stair training, Moist heat, Taping, and Manual therapy  Plan For Next Session:   warm-up, STSs, challenge gait with weights, strength building    Dyneisha Murchison, PT, DPT  02/28/22, 2:40pm

## 2022-03-07 ENCOUNTER — Ambulatory Visit: Payer: Medicare Other

## 2022-03-07 DIAGNOSIS — R2689 Other abnormalities of gait and mobility: Secondary | ICD-10-CM

## 2022-03-07 DIAGNOSIS — R293 Abnormal posture: Secondary | ICD-10-CM

## 2022-03-07 NOTE — Addendum Note (Signed)
Addended by: Darryl Lent on: 03/07/2022 04:47 PM   Modules accepted: Orders

## 2022-03-07 NOTE — Therapy (Signed)
OUTPATIENT PHYSICAL THERAPY TREATMENT  Patient Name: Karen Frank MRN: [id number] DOB:May 07, 1946, 76 y.o., female Today's Date: 03/07/2022   PT End of Session - 03/07/22 1444     Visit Number 11    Number of Visits 22    Date for PT Re-Evaluation 04/25/22    Authorization Type IE: 11/28/21, PN/RC: 02/28/22    PT Start Time 1445    PT Stop Time 1545    PT Time Calculation (min) 60 min    Equipment Utilized During Treatment Gait belt    Activity Tolerance Patient tolerated treatment well             History reviewed. No pertinent past medical history. History reviewed. No pertinent surgical history. There are no problems to display for this patient.   PCP: Jordan Likes, MD  REFERRING PROVIDER: Vladimir Crofts, MD  REFERRING DIAG: R26.89 (ICD-10-CM) - Imbalance  THERAPY DIAG:  Impairment of balance  Abnormal posture  Other abnormalities of gait and mobility  Rationale for Evaluation and Treatment Rehabilitation  ONSET DATE: About a year ago                                                                                                                                                                                  PRECAUTIONS: None  WEIGHT BEARING RESTRICTIONS: No  FALLS:  Has patient fallen in last 6 months? No  OCCUPATION/SOCIAL ACTIVITIES: walking outside, activities around the home  PLOF: Independent with gait  PERTINENT HISTORY/CHART REVIEW: Internal medicine appointment 12/11/21 and neurology appointment 03/13/22   OBJECTIVE:   DIAGNOSTIC TESTING/IMPRESSIONS:  IMPRESSION: (06/22/21) 1. Ventriculomegaly, slightly progressed from 06/15/2014. This does not appear significantly disproportionate to the cerebral sulci. This however does not exclude the diagnosis of normal pressure hydrocephalus in the appropriate context. 2. Moderate findings of follow-up chronic small vessel disease as further detailed above. 3. Findings consistent with sequelae of  remote microhemorrhage in the left posterior frontal lobe.  CT Lumbar spine without contrast 05/07/2021: 1. No acute fracture or listhesis. There is chronic-appearing anterolisthesis of L4 on L5. 2. 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   SUBJECTIVE:       Pt did have an episode of where she felt dizzy when she went to stand up but it went away. Pt has BP cuff and checks it every so often, but is not on BP medication.  PAIN:  Are you having pain? No   OBJECTIVE:  POSTURE: (12/05/21) In standing, increased R shoulder height, increased rounded shoulders, forward head posture  Posterior pelvic tilt in sitting  Thoracic kyphosis: increased in standing and sitting Iliac crest height: L higher   GAIT: 11/28/21  Distance walked: ~  130f Assistive device utilized: Use of SPC on/off  Level of assistance: CGA Comments: decreased step length, downward gaze (more probable due to impaired vision), decreased hip flexion B, poor clearance of B feet (no LOB)    SENSATION: Light touch: Intact at L2-S2 dermatomes   LUMBAR AROM/PROM: Deferred 2/2 time constraints   AROM (Normal range in degrees) AROM   Flexion (65)   Extension (25-30)   Right lateral flexion (25)   Left lateral flexion (25)   Right rotation (30)   Left rotation (30)    (*= pain, Blank rows = not tested)  LOWER EXTREMITY ROM:   PROM (Normal range in degrees) Right 12/05/21 Left 12/05/21  Hip flexion (0-125) In sidelying (restricted) WFL  Hip extension (0-15)    Hip abduction (0-40) [facility] Hip adduction    Hip internal rotation (0-45)    Hip external rotation (0-45)    Knee flexion [facility] Knee extension    Ankle dorsiflexion    Ankle plantarflexion    Ankle inversion    Ankle eversion     (*= pain, Blank rows = not tested)  LOWER EXTREMITY MMT:  MMT Right 11/27/21 Left 11/27/21 Left 02/28/22 Right 02/28/22  Hip flexion _0 Hip extension _1 Hip abduction 4  4    Hip adduction      Hip  internal rotation      Hip external rotation      Knee flexion _2 Knee extension _3 Ankle dorsiflexion (in seated) _4 Ankle plantarflexion      Ankle inversion      Ankle eversion      (*= pain, Blank rows = not tested)   PHYSICAL PERFORMANCE MEASURES:  STS: Gower's sign and inability to control descent   RLE SLS:  LLE SLS: 10MWT: .59 m/s (limited community ambulator) (12/05/21)  5TSTS: 14 seconds (11/27/21)   TODAY'S TREATMENT   Therapeutic Activity: BP check at beginning of session due to dizziness:  177/99 mmHg, 79 BPM  182/107 mmHg, 79 BPM after 5 mins of seated rest and diaphragmatic breathing  170/90 mmHg with manual cuff after 10 mins of sitting 172/109 mmHg with manual cuff taken by AGarlon Hatchet OT after 5 mins or more of resting  170/103 mmHg with automatic cuff   -During all BP checks, Pt remains asymptomatic other than feeling worried/anxious   Discussion on F.A.S.T acronym in relation to when to call 911 if signs/symptoms of a stroke appears as Pt lives alone   Patient response to interventions: Pt verbalizes that she will be attending 10:15am [facility]   Patient Education:  Pt provided with HEP: no change. Patient educated throughout session on appropriate technique and form using multi-modal cueing, HEP, and activity modification. Patient will benefit from further education in order to maximize compliance and understanding for long-term therapeutic gains.    ASSESSMENT:  Clinical Impression: Patient presents to clinic with excellent motivation to participate in today's session. Upon discussion and further investigation, Pt reports increased episodes of dizziness during transitional movements (lying to standing) but usually goes away rapidly. Pt denies increased stabbing pain in the head, nausea, or HA. Pt also mentioned that the last provider she saw (Dr. WVenetia Maxon told her to monitor her BP at home as  her numbers were a little high. After various bouts of BP monitoring throughout session  with automatic and manual cuff, Pt's systolic and diastolic numbers were high (please see above) but Pt continued to be asymptomatic. With Pt in session, time taken to call [facility] for further advice and next steps. Per physician's nurse, Hassan Rowan, Pt advised to go to [place] walk-in clinic to be further assessed to avoid a significant cardiac or neurologic event. After discussion, Pt rather attend an appt in the morning at [facility] to avoid long wait times at the walk-in clinic. DPT took time to address stroke acronym F.A.S.T as Pt drives in the community and lives alone. Will keep monitoring Pt's chart for further updates as this may impact further progression in physical therapy. Pt will continue to benefit from benefit from skilled therapeutic intervention to address deficits in gait, balance, LE muscle strength, ROM, endurance, and posture in order to increase independence in the home and in the community as well as improve overall QOL.    Objective Impairments: Abnormal gait, decreased activity tolerance, decreased balance, decreased endurance, decreased mobility, difficulty walking, decreased strength, decreased safety awareness, and postural dysfunction.   Activity Limitations: carrying, sitting, standing, stairs, transfers, continence, and locomotion level  Personal Factors: Age, Past/current experiences, Time since onset of injury/illness/exacerbation, and 1-2 comorbidities: severe lumbar stenosis, ventriculomegaly are also affecting patient's functional outcome.   Rehab Potential: Good  Clinical Decision Making: Evolving/moderate complexity  Evaluation Complexity: Moderate   GOALS: Goals reviewed with patient? Yes  SHORT TERM GOALS: Target date: 03/02/2022  Patient will be able to walk >/= 121f with LRAD and no more than SPV in order to transition towards independence in the home  and in the community while minimizing risk of falls.  Baseline: ~1056fwith SPC and CGA, (7/26): >10036fith SPC and CGA; (8/23): >100 ft with no AD and CGA; (9/13): >100f29fth SPC and without SPC, SPV Goal status: MET   LONG TERM GOALS: Target date: 04/25/2022  Patient will be independent in the performance of a HEP in order to improve muscular strength, gait mechanics, and balance.  Baseline: seated hip flexion/toe/heel raises, (6/20) [id number], (7/26): continuing with seated toe/heel raises, standing marches at counter, STSs at counter Goal status: IN PROGRESS   2.  Patient will be able to improve 5TSTS score to </= 12 seconds in order to match age-predicted norms and reduce risk for falls in the home and community.  Baseline: 14 seconds, (7/25): 10.5 seconds  Goal status: MET  3. Patient will be able to walk IND >/= 150ft34fh LRAD  in order to demonstrate improved balance and carryover into mobility in the home and in the community. Baseline: unable to walk 150ft 23fto tolerance, (6/20): ~300ft n27f, CGA, (7/25): 300ft no19f CGA  Goal status: IN PROGRESS   4. Patient will be able to improve gait speed on 10MWT to >/= 0.69m/s wit4mRAD to demonstrate improved LE strength, balance, and mobility in order to safely ambulate in the community without limitation.  Baseline: (7/26): .64m/s; (925m: .59m/s  [id number]: IN PROGRESS   PLAN: PT Frequency: 1x/week  PT Duration: 10 weeks  Planned Interventions: Therapeutic exercises, Therapeutic activity, Neuromuscular re-education, Balance training, Gait training, Patient/Family education, Joint mobilization, Stair training, Moist heat, Taping, and Manual therapy  Plan For Next Session:   BP! warm-up, STSs, challenge gait with weights, strength building    Anaclara Acklin, PT, DPT  03/07/22, 2:45pm

## 2022-03-15 ENCOUNTER — Telehealth: Payer: Self-pay

## 2022-03-15 ENCOUNTER — Ambulatory Visit: Payer: Medicare Other

## 2022-03-15 NOTE — Telephone Encounter (Signed)
LVM to call office back in regards to no-show today to PT.

## 2022-03-21 ENCOUNTER — Telehealth: Payer: Self-pay

## 2022-03-21 ENCOUNTER — Ambulatory Visit: Payer: Medicare Other

## 2022-03-21 NOTE — Telephone Encounter (Signed)
LVM to address high BP and to see if Pt called Dr's office. Given office number to call back.

## 2022-03-28 ENCOUNTER — Ambulatory Visit: Payer: Medicare Other | Attending: Neurology

## 2022-03-28 DIAGNOSIS — R293 Abnormal posture: Secondary | ICD-10-CM | POA: Diagnosis present

## 2022-03-28 DIAGNOSIS — R2689 Other abnormalities of gait and mobility: Secondary | ICD-10-CM | POA: Diagnosis not present

## 2022-03-28 NOTE — Therapy (Signed)
OUTPATIENT PHYSICAL THERAPY TREATMENT  Patient Name: Karen Frank MRN: [id number] DOB:19-Mar-1946, 76 y.o., female Today's Date: 03/28/2022   PT End of Session - 03/28/22 1521     Visit Number 12    Number of Visits 22    Date for PT Re-Evaluation 04/25/22    Authorization Type IE: 11/28/21, PN/RC: 02/28/22    PT Start Time 1525    PT Stop Time 1605    PT Time Calculation (min) 40 min    Equipment Utilized During Treatment Gait belt    Activity Tolerance Patient tolerated treatment well             History reviewed. No pertinent past medical history. History reviewed. No pertinent surgical history. There are no problems to display for this patient.   PCP: Jordan Likes, MD  REFERRING PROVIDER: Vladimir Crofts, MD  REFERRING DIAG: R26.89 (ICD-10-CM) - Imbalance  THERAPY DIAG:  Impairment of balance  Abnormal posture  Other abnormalities of gait and mobility  Rationale for Evaluation and Treatment Rehabilitation  ONSET DATE: About a year ago                                                                                                                                                                                  PRECAUTIONS: None  WEIGHT BEARING RESTRICTIONS: No  FALLS:  Has patient fallen in last 6 months? No  OCCUPATION/SOCIAL ACTIVITIES: walking outside, activities around the home  PLOF: Independent with gait  PERTINENT HISTORY/CHART REVIEW: Internal medicine appointment 12/11/21 and neurology appointment 03/13/22   OBJECTIVE:   DIAGNOSTIC TESTING/IMPRESSIONS:  IMPRESSION: (06/22/21) 1. Ventriculomegaly, slightly progressed from 06/15/2014. This does not appear significantly disproportionate to the cerebral sulci. This however does not exclude the diagnosis of normal pressure hydrocephalus in the appropriate context. 2. Moderate findings of follow-up chronic small vessel disease as further detailed above. 3. Findings consistent with sequelae of  remote microhemorrhage in the left posterior frontal lobe.  CT Lumbar spine without contrast 05/07/2021: 1. No acute fracture or listhesis. There is chronic-appearing anterolisthesis of L4 on L5. 2. 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    SUBJECTIVE:       Pt has not been seen since BP situation almost 3 weeks ago. Since then Pt has seen Dr. Venetia Maxon and placed on BP medications. Pt denies any side effects from medication and has been taking BP at home. Pt has been walking and doing well. Pt is not using SPC around the house as much and uses it when going out.    PAIN:  Are you having pain? No    OBJECTIVE:  POSTURE: (12/05/21) In standing, increased R shoulder height, increased rounded shoulders,  forward head posture  Posterior pelvic tilt in sitting  Thoracic kyphosis: increased in standing and sitting Iliac crest height: L higher   GAIT: 11/28/21  Distance walked: ~119f Assistive device utilized: Use of SPC on/off  Level of assistance: CGA Comments: decreased step length, downward gaze (more probable due to impaired vision), decreased hip flexion B, poor clearance of B feet (no LOB)    SENSATION: Light touch: Intact at L2-S2 dermatomes   LUMBAR AROM/PROM: Deferred 2/2 time constraints   AROM (Normal range in degrees) AROM   Flexion (65)   Extension (25-30)   Right lateral flexion (25)   Left lateral flexion (25)   Right rotation (30)   Left rotation (30)    (*= pain, Blank rows = not tested)  LOWER EXTREMITY ROM:   PROM (Normal range in degrees) Right 12/05/21 Left 12/05/21  Hip flexion (0-125) In sidelying (restricted) WFL  Hip extension (0-15)    Hip abduction (0-40) [facility] Hip adduction    Hip internal rotation (0-45)    Hip external rotation (0-45)    Knee flexion [facility] Knee extension    Ankle dorsiflexion    Ankle plantarflexion    Ankle inversion    Ankle eversion     (*= pain, Blank rows = not tested)  LOWER EXTREMITY MMT:  MMT  Right 11/27/21 Left 11/27/21 Left 02/28/22 Right 02/28/22  Hip flexion _0 Hip extension _1 Hip abduction 4  4    Hip adduction      Hip internal rotation      Hip external rotation      Knee flexion _2 Knee extension _3 Ankle dorsiflexion (in seated) _4 Ankle plantarflexion      Ankle inversion      Ankle eversion      (*= pain, Blank rows = not tested)   PHYSICAL PERFORMANCE MEASURES:  STS: Gower's sign and inability to control descent   RLE SLS:  LLE SLS: 10MWT: .59 m/s (limited community ambulator) (12/05/21)  5TSTS: 14 seconds (11/27/21)   TODAY'S TREATMENT  Neuromuscular Re-education: Gait training:  -350 ft of gait for warm-up with no AD and SPV, minimal cueing required to increase R hip flexion to decrease foot drag, RPE - 8/10  Seated 5 min rest break required with cueing for diaphragmatic breathing  Returned to a 4/10  Significant improvement in turns with no cueing required   Seated diaphragmatic breathing for improved downregulation of nervous system after warm-up   Continued gait training for improved endurance -  ~7034fof gait with one standing rest break in between (~2 mins) and then continued walking with cueing for increase in R hip flexion   ~35037ff gait with 1.5 lb weight around ankles to emphasize increased hip flexion and Pt observed to drag B feet, requiring cueing   Standing marches with 1.5 lb weight for improved hip flexion to be carried over into gait, Pt unable to perform with single UE, used BUE    Therapeutic Activity: BP check at beginning of session: 166/85 mmHg, 79 BPM    BP check after 1st gait warm-up 148/85 mmHg, 79 BPM   End of session - 148/90 mmHg, 78BPM  Discussion on BP reading via wrist monitor. DPT printed out picture to show pharmacist at local store to purchase as Pt reports having difficult taking her BP in sitting and strapping the  device at the upper arm.   Patient response to  interventions: Pt with 8/10 RPE at end of session    Patient Education:  Pt provided with HEP: no change. Patient educated throughout session on appropriate technique and form using multi-modal cueing, HEP, and activity modification. Patient will benefit from further education in order to maximize compliance and understanding for long-term therapeutic gains.    ASSESSMENT:  Clinical Impression: Patient presents to clinic with excellent motivation to participate in today's session. Pt continues to demonstrate deficits in gait, balance, LE muscle strength, ROM, endurance, and posture. Since last session, Pt's BP is being monitored by Dr. Venetia Maxon and has been started on BP medication. Pt reports taking BP at home without any sxs or side effects from medication. First value of BP after sitting for 5 mins was 166/85 mmHg, but after 1st bout of gait reduced to 148/85 mmHg. DPT mentioned that anxiety of BP readings can contribute to higher readings and we will continue to monitor as sessions progress. Pt has demonstrated significant improvement in turns during gait training and minimal cueing required to lift the RLE into hip flexion. However, as Pt fatigues and with longer distances, BLE begin to fatigue (8/10). Pt responded positively to all active and educational interventions. Pt ended session with 148/90 mmHg. Pt will continue to benefit from benefit from skilled therapeutic intervention to address deficits in gait, balance, LE muscle strength, ROM, endurance, and posture in order to increase independence in the home and in the community as well as improve overall QOL.    Objective Impairments: Abnormal gait, decreased activity tolerance, decreased balance, decreased endurance, decreased mobility, difficulty walking, decreased strength, decreased safety awareness, and postural dysfunction.   Activity Limitations: carrying, sitting, standing, stairs, transfers, continence, and locomotion  level  Personal Factors: Age, Past/current experiences, Time since onset of injury/illness/exacerbation, and 1-2 comorbidities: severe lumbar stenosis, ventriculomegaly are also affecting patient's functional outcome.   Rehab Potential: Good  Clinical Decision Making: Evolving/moderate complexity  Evaluation Complexity: Moderate   GOALS: Goals reviewed with patient? Yes  SHORT TERM GOALS: Target date: 03/02/2022  Patient will be able to walk >/= 19f with LRAD and no more than SPV in order to transition towards independence in the home and in the community while minimizing risk of falls.  Baseline: ~1033fwith SPC and CGA, (7/26): >10067fith SPC and CGA; (8/23): >100 ft with no AD and CGA; (9/13): >100f46fth SPC and without SPC, SPV Goal status: MET   LONG TERM GOALS: Target date: 04/25/2022  Patient will be independent in the performance of a HEP in order to improve muscular strength, gait mechanics, and balance.  Baseline: seated hip flexion/toe/heel raises, (6/20) [id number], (7/26): continuing with seated toe/heel raises, standing marches at counter, STSs at counter Goal status: IN PROGRESS   2.  Patient will be able to improve 5TSTS score to </= 12 seconds in order to match age-predicted norms and reduce risk for falls in the home and community.  Baseline: 14 seconds, (7/25): 10.5 seconds  Goal status: MET  3. Patient will be able to walk IND >/= 150ft62fh LRAD  in order to demonstrate improved balance and carryover into mobility in the home and in the community. Baseline: unable to walk 150ft 46fto tolerance, (6/20): ~300ft n47f, CGA, (7/25): 300ft no70f CGA  Goal status: IN PROGRESS   4. Patient will be able to improve gait speed on 10MWT to >/= 0.110m/s wit4mRAD to demonstrate improved LE strength, balance,  and mobility in order to safely ambulate in the community without limitation.  Baseline: (7/26): .17ms; (9/13): .551m  Goal status: IN PROGRESS   PLAN: PT  Frequency: 1x/week  PT Duration: 10 weeks  Planned Interventions: Therapeutic exercises, Therapeutic activity, Neuromuscular re-education, Balance training, Gait training, Patient/Family education, Joint mobilization, Stair training, Moist heat, Taping, and Manual therapy  Plan For Next Session:   BP! warm-up, STSs, challenge walking with weight, balance    Maddalynn Barnard, PT, DPT  03/28/22, 3:20pm

## 2022-04-04 ENCOUNTER — Ambulatory Visit: Payer: Medicare Other

## 2022-04-04 DIAGNOSIS — R2689 Other abnormalities of gait and mobility: Secondary | ICD-10-CM | POA: Diagnosis not present

## 2022-04-04 DIAGNOSIS — R293 Abnormal posture: Secondary | ICD-10-CM

## 2022-04-04 NOTE — Therapy (Signed)
OUTPATIENT PHYSICAL THERAPY TREATMENT  Patient Name: Karen Frank MRN: [id number] DOB:[birth year], 76 y.o., female Today's Date: 04/04/2022   PT End of Session - 04/04/22 1423     Visit Number 13    Number of Visits 22    Date for PT Re-Evaluation 04/25/22    Authorization Type IE: 11/28/21, PN/RC: 02/28/22    PT Start Time 1430    PT Stop Time 1510    PT Time Calculation (min) 40 min    Equipment Utilized During Treatment Gait belt    Activity Tolerance Patient tolerated treatment well             History reviewed. No pertinent past medical history. History reviewed. No pertinent surgical history. There are no problems to display for this patient.   PCP: Fonnie Mu, MD  REFERRING PROVIDER: Lonell Face, MD  REFERRING DIAG: R26.89 (ICD-10-CM) - Imbalance  THERAPY DIAG:  Impairment of balance  Abnormal posture  Other abnormalities of gait and mobility  Rationale for Evaluation and Treatment Rehabilitation  ONSET DATE: About a year ago                                                                                                                                                                                  PRECAUTIONS: None  WEIGHT BEARING RESTRICTIONS: No  FALLS:  Has patient fallen in last 6 months? No  OCCUPATION/SOCIAL ACTIVITIES: walking outside, activities around the home  PLOF: Independent with gait  PERTINENT HISTORY/CHART REVIEW: Internal medicine appointment 12/11/21 and neurology appointment 03/13/22   OBJECTIVE:   DIAGNOSTIC TESTING/IMPRESSIONS:  IMPRESSION: (06/22/21) 1. Ventriculomegaly, slightly progressed from 06/15/2014. This does not appear significantly disproportionate to the cerebral sulci. This however does not exclude the diagnosis of normal pressure hydrocephalus in the appropriate context. 2. Moderate findings of follow-up chronic small vessel disease as further detailed above. 3. Findings consistent with sequelae of  remote microhemorrhage in the left posterior frontal lobe.  CT Lumbar spine without contrast 05/07/2021: 1. No acute fracture or listhesis. There is chronic-appearing anterolisthesis of L4 on L5. 2. 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    SUBJECTIVE:       Pt had 3 days where she felt dizzy and would lose her balance when she tried to stand. Pt has been taking her BP medication and started to take zinc and vitamin D3. Pt has not invested in a wrist BP monitor but has been using the brachial cuff to measure. Pt denies any numbness in the UE/LE/face during these 3 days but came on suddenly. Pt used RW around the home and that helped with no dizziness. Pt did not have any dizziness starting Sunday.  PAIN:  Are you having pain? No    OBJECTIVE:  POSTURE: (12/05/21) In standing, increased R shoulder height, increased rounded shoulders, forward head posture  Posterior pelvic tilt in sitting  Thoracic kyphosis: increased in standing and sitting Iliac crest height: L higher   GAIT: 11/28/21  Distance walked: ~187ft Assistive device utilized: Use of SPC on/off  Level of assistance: CGA Comments: decreased step length, downward gaze (more probable due to impaired vision), decreased hip flexion B, poor clearance of B feet (no LOB)    SENSATION: Light touch: Intact at L2-S2 dermatomes   LUMBAR AROM/PROM: Deferred 2/2 time constraints   AROM (Normal range in degrees) AROM   Flexion (65)   Extension (25-30)   Right lateral flexion (25)   Left lateral flexion (25)   Right rotation (30)   Left rotation (30)    (*= pain, Blank rows = not tested)  LOWER EXTREMITY ROM:   PROM (Normal range in degrees) Right 12/05/21 Left 12/05/21  Hip flexion (0-125) In sidelying (restricted) WFL  Hip extension (0-15)    Hip abduction (0-40) [facility]  Hip adduction    Hip internal rotation (0-45)    Hip external rotation (0-45)    Knee flexion [facility]  Knee extension    Ankle dorsiflexion     Ankle plantarflexion    Ankle inversion    Ankle eversion     (*= pain, Blank rows = not tested)  LOWER EXTREMITY MMT:  MMT Right 11/27/21 Left 11/27/21 Left 02/28/22 Right 02/28/22  Hip flexion $RemoveBef'4 4 5 5  'YlwyACWMjV$ Hip extension $RemoveBefor'4 4 4 4  'NtmjTiAyEdJs$ Hip abduction 4  4    Hip adduction      Hip internal rotation      Hip external rotation      Knee flexion $RemoveBefo'5 5 4 4  'lSdlcQIAYxF$ Knee extension $RemoveBefore'4 4 4 5  'qSystjDPCXEQu$ Ankle dorsiflexion (in seated) $RemoveBef'5 5 5 5  'vNDqhZBKpl$ Ankle plantarflexion      Ankle inversion      Ankle eversion      (*= pain, Blank rows = not tested)   PHYSICAL PERFORMANCE MEASURES:  STS: Gower's sign and inability to control descent   RLE SLS:  LLE SLS: 10MWT: .59 m/s (limited community ambulator) (12/05/21)  5TSTS: 14 seconds (11/27/21)   TODAY'S TREATMENT  Therapeutic Exercises:  Seated marches with 1.5lb, 2x10, for LE strengthening    Neuromuscular Re-education: Gait training:  -330ft x 3 of gait for no AD and SPV, moderate cueing required to increase R hip flexion to decrease foot drag, RPE after first bout 5/10 then 7/10 at end  Standing rest break for a few mins in between bouts  Cognitive dual task - (name vegetables then fruits)  Pts gait tempo decreased and decreased heel strike  Returned to a 4/10 after break Ended with cognitive dual task - (count by4 and repeating months backwards)   Brief discussion on assessing for vestibular dysfunction if Pt continues to have episodes of lightheadedness/dizziness upon standing. Described otoconia crystals and purpose of them in the inner ear. Pt verbalized understanding.  Therapeutic Activity: BP check at beginning of session: 142/72 mmHg, 68 BPM   BP check after 51ft of gait due to increased lightheadedness  143/871 mmHg, 70 BPM   BP check in standing - 146/74 mmHg, to check for postural hypotension - negative and no sxs   BO at end of session - 151/78 mmHg, 70  BPM   Patient response to interventions: Pt with 8/10 RPE  at end of session    Patient  Education:  Pt provided with HEP: no change. Patient educated throughout session on appropriate technique and form using multi-modal cueing, HEP, and activity modification. Patient will benefit from further education in order to maximize compliance and understanding for long-term therapeutic gains.    ASSESSMENT:  Clinical Impression: Patient presents to clinic with excellent motivation to participate in today's session. Pt continues to demonstrate deficits in gait, balance, LE muscle strength, ROM, endurance, and posture. Since last session, Pt reports a few days of increased dizziness/lightheadedness. Pt denies N/T in LE/UE/face or loss of balance during those days. Pt became hesitant to walk due to increased dizziness upon standing. Per Pt, BP checked and "around the upper 130s and 140s." DPT advised Pt to call Dr's office to report this change as Pt is new to taking BP medication and is at increased risk for falls. Brief discussion on the vestibular system and can perform assessment to check for vestibular dysfunction. BP at beginning of session 142/72 mmHg and ended session with 151/78 mmHg and 8/10 RPE. Pt did report one bout of dizziness upon standing with first bout of gait training. After repeated BP in sitting and standing (test postural hypotension), Pt with normal BP readings. Pt challenged with cognitive dual-tasks during amb and demonstrated deficits with decreased gait tempo, decreased heel strike, and increased R foot drag but no LOB. Pt will continue to benefit from benefit from skilled therapeutic intervention to address deficits in gait, balance, LE muscle strength, ROM, endurance, and posture in order to increase independence in the home and in the community as well as improve overall QOL.    Objective Impairments: Abnormal gait, decreased activity tolerance, decreased balance, decreased endurance, decreased mobility, difficulty walking, decreased strength, decreased safety awareness,  and postural dysfunction.   Activity Limitations: carrying, sitting, standing, stairs, transfers, continence, and locomotion level  Personal Factors: Age, Past/current experiences, Time since onset of injury/illness/exacerbation, and 1-2 comorbidities: severe lumbar stenosis, ventriculomegaly are also affecting patient's functional outcome.   Rehab Potential: Good  Clinical Decision Making: Evolving/moderate complexity  Evaluation Complexity: Moderate   GOALS: Goals reviewed with patient? Yes  SHORT TERM GOALS: Target date: 03/02/2022  Patient will be able to walk >/= 144ft with LRAD and no more than SPV in order to transition towards independence in the home and in the community while minimizing risk of falls.  Baseline: ~114ft with SPC and CGA, (7/26): >159ft with SPC and CGA; (8/23): >100 ft with no AD and CGA; (9/13): >151ft with SPC and without SPC, SPV Goal status: MET   LONG TERM GOALS: Target date: 04/25/2022  Patient will be independent in the performance of a HEP in order to improve muscular strength, gait mechanics, and balance.  Baseline: seated hip flexion/toe/heel raises, (6/20) [id number], (7/26): continuing with seated toe/heel raises, standing marches at counter, STSs at counter Goal status: IN PROGRESS   2.  Patient will be able to improve 5TSTS score to </= 12 seconds in order to match age-predicted norms and reduce risk for falls in the home and community.  Baseline: 14 seconds, (7/25): 10.5 seconds  Goal status: MET  3. Patient will be able to walk IND >/= 169ft with LRAD  in order to demonstrate improved balance and carryover into mobility in the home and in the community. Baseline: unable to walk 139ft due to tolerance, (6/20): ~325ft no AD, CGA, (7/25): 39ft no AD, CGA  Goal status: IN PROGRESS   4. Patient will be  able to improve gait speed on 10MWT to >/= 0.33m/s with LRAD to demonstrate improved LE strength, balance, and mobility in order to safely  ambulate in the community without limitation.  Baseline: (7/26): .83m/s; (9/13): .53m/s  Goal status: IN PROGRESS   PLAN: PT Frequency: 1x/week  PT Duration: 10 weeks  Planned Interventions: Therapeutic exercises, Therapeutic activity, Neuromuscular re-education, Balance training, Gait training, Patient/Family education, Joint mobilization, Stair training, Moist heat, Taping, and Manual therapy  Plan For Next Session:   BP! warm-up, STSs, balance challenges     Psalms Olarte, PT, DPT  04/04/22, 2:30pm

## 2022-04-11 ENCOUNTER — Ambulatory Visit: Payer: Medicare Other

## 2022-04-11 ENCOUNTER — Other Ambulatory Visit: Payer: Self-pay | Admitting: Family Medicine

## 2022-04-11 DIAGNOSIS — R42 Dizziness and giddiness: Secondary | ICD-10-CM

## 2022-04-11 DIAGNOSIS — R2689 Other abnormalities of gait and mobility: Secondary | ICD-10-CM | POA: Diagnosis not present

## 2022-04-11 DIAGNOSIS — R293 Abnormal posture: Secondary | ICD-10-CM

## 2022-04-11 DIAGNOSIS — R29898 Other symptoms and signs involving the musculoskeletal system: Secondary | ICD-10-CM

## 2022-04-11 NOTE — Therapy (Signed)
OUTPATIENT PHYSICAL THERAPY TREATMENT  Patient Name: Karen Frank MRN: [id number] DOB:1945-10-11, 76 y.o., female Today's Date: 04/11/2022   PT End of Session - 04/11/22 1430     Visit Number 14    Number of Visits 22    Date for PT Re-Evaluation 04/25/22    Authorization Type IE: 11/28/21, PN/RC: 02/28/22    PT Start Time 1433    PT Stop Time 1513    PT Time Calculation (min) 40 min    Equipment Utilized During Treatment Gait belt    Activity Tolerance Patient tolerated treatment well             History reviewed. No pertinent past medical history. History reviewed. No pertinent surgical history. There are no problems to display for this patient.   PCP: Jordan Likes, MD  REFERRING PROVIDER: Vladimir Crofts, MD  REFERRING DIAG: R26.89 (ICD-10-CM) - Imbalance  THERAPY DIAG:  Impairment of balance  Other abnormalities of gait and mobility  Abnormal posture  Rationale for Evaluation and Treatment Rehabilitation  ONSET DATE: About a year ago                                                                                                                                                                                  PRECAUTIONS: None  WEIGHT BEARING RESTRICTIONS: No  FALLS:  Has patient fallen in last 6 months? No  OCCUPATION/SOCIAL ACTIVITIES: walking outside, activities around the home  PLOF: Independent with gait  PERTINENT HISTORY/CHART REVIEW: Internal medicine appointment 12/11/21 and neurology appointment 03/13/22   OBJECTIVE:   DIAGNOSTIC TESTING/IMPRESSIONS:  IMPRESSION: (06/22/21) 1. Ventriculomegaly, slightly progressed from 06/15/2014. This does not appear significantly disproportionate to the cerebral sulci. This however does not exclude the diagnosis of normal pressure hydrocephalus in the appropriate context. 2. Moderate findings of follow-up chronic small vessel disease as further detailed above. 3. Findings consistent with sequelae of  remote microhemorrhage in the left posterior frontal lobe.  CT Lumbar spine without contrast 05/07/2021: 1. No acute fracture or listhesis. There is chronic-appearing anterolisthesis of L4 on L5. 2. 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    SUBJECTIVE:       Pt had a visit with Dr. Venetia Maxon on 04/09/22 and is now taking 2x daily $Remove'5mg'vrwQAVR$ . Pt reports no dizziness over the past week but reports LE feels weaker. Dr. Venetia Maxon also ordered another CT scan but is waiting to hear back from scheduling. Pt does reports some headaches but not an increase from her usual headaches.    PAIN:  Are you having pain? No    OBJECTIVE:  POSTURE: (12/05/21) In standing, increased R shoulder height, increased rounded shoulders, forward head posture  Posterior pelvic tilt in sitting  Thoracic kyphosis: increased in standing and sitting Iliac crest height: L higher   GAIT: 11/28/21  Distance walked: ~146ft Assistive device utilized: Use of SPC on/off  Level of assistance: CGA Comments: decreased step length, downward gaze (more probable due to impaired vision), decreased hip flexion B, poor clearance of B feet (no LOB)    SENSATION: Light touch: Intact at L2-S2 dermatomes   LUMBAR AROM/PROM: Deferred 2/2 time constraints   AROM (Normal range in degrees) AROM   Flexion (65)   Extension (25-30)   Right lateral flexion (25)   Left lateral flexion (25)   Right rotation (30)   Left rotation (30)    (*= pain, Blank rows = not tested)  LOWER EXTREMITY ROM:   PROM (Normal range in degrees) Right 12/05/21 Left 12/05/21  Hip flexion (0-125) In sidelying (restricted) WFL  Hip extension (0-15)    Hip abduction (0-40) [facility]  Hip adduction    Hip internal rotation (0-45)    Hip external rotation (0-45)    Knee flexion [facility]  Knee extension    Ankle dorsiflexion    Ankle plantarflexion    Ankle inversion    Ankle eversion     (*= pain, Blank rows = not tested)  LOWER EXTREMITY MMT:  MMT  Right 11/27/21 Left 11/27/21 Left 02/28/22 Right 02/28/22  Hip flexion $RemoveBef'4 4 5 5  'qdYwnyFnnV$ Hip extension $RemoveBefor'4 4 4 4  'DHhSzGItfOhB$ Hip abduction 4  4    Hip adduction      Hip internal rotation      Hip external rotation      Knee flexion $RemoveBefo'5 5 4 4  'hUIVccmOpny$ Knee extension $RemoveBefore'4 4 4 5  'cDgcLFvbwLtrN$ Ankle dorsiflexion (in seated) $RemoveBef'5 5 5 5  'yUqlHuMzyD$ Ankle plantarflexion      Ankle inversion      Ankle eversion      (*= pain, Blank rows = not tested)   PHYSICAL PERFORMANCE MEASURES:  STS: Gower's sign and inability to control descent   RLE SLS:  LLE SLS: 10MWT: .59 m/s (limited community ambulator) (12/05/21)  5TSTS: 14 seconds (11/27/21)   TODAY'S TREATMENT  Therapeutic Exercises:  Seated marches with 1.5lb, 2x10, for LE strengthening    Neuromuscular Re-education: Gait training for warm-up:  -360ft x 1 of gait for no AD and CGA (due to more fatigue), minimal cueing required to increase R hip flexion to decrease foot drag RPE after - 7/10 and more fatigue in LEs  Balance activities:  -Airex with feet apart, no use of UE, 15 secs x 3  -Airex with feet together, no use of UE, 15 secs x 3 -Airex semi-tandem stance, RLE back was more difficult and required 2 fingers on bar, 3 sets of 15 secs each leg -End 8/10 RPE   Standing foot taps at stairs with no BUE use and CGA to challenge balance and improve control of hip flexion to aid in gait  Cueing required to avoid sliding of foot down from step   Therapeutic Activity: BP check at beginning of session: 136/76 mmHg, 75 BPM    Patient response to interventions: Pt with 6/10 RPE at end of session    Patient Education:  Pt provided with HEP: no change. Patient educated throughout session on appropriate technique and form using multi-modal cueing, HEP, and activity modification. Patient will benefit from further education in order to maximize compliance and understanding for long-term therapeutic gains.    ASSESSMENT:  Clinical Impression: Patient presents to  clinic with excellent  motivation to participate in today's session. Pt continues to demonstrate deficits in gait, balance, LE muscle strength, ROM, endurance, and posture. Since last session, Pt visiting Dr. Venetia Maxon on 04/09/22. Pt is now taking 2x5 mg of Amlodipine medication for HTN and another CT scan ordered to assess ventriculomegaly. Pt's BP within normal limits at beginning of session: 136/76 mmHg. Pt with no increased dizziness during session or within the last week. Pt's gait with no abnormal deficits and has improved with minimal cueing to lift RLE and maintain erect posture. Pt's balance challenged today with a different surface (Airex) and various foot positioning. Pt found semi-tandem to be the most difficult requiring CGA and use of UE especially when the RLE behind the left. Pt will continue to benefit from benefit from skilled therapeutic intervention to address deficits in gait, balance, LE muscle strength, ROM, endurance, and posture in order to increase independence in the home and in the community as well as improve overall QOL.    Objective Impairments: Abnormal gait, decreased activity tolerance, decreased balance, decreased endurance, decreased mobility, difficulty walking, decreased strength, decreased safety awareness, and postural dysfunction.   Activity Limitations: carrying, sitting, standing, stairs, transfers, continence, and locomotion level  Personal Factors: Age, Past/current experiences, Time since onset of injury/illness/exacerbation, and 1-2 comorbidities: severe lumbar stenosis, ventriculomegaly are also affecting patient's functional outcome.   Rehab Potential: Good  Clinical Decision Making: Evolving/moderate complexity  Evaluation Complexity: Moderate   GOALS: Goals reviewed with patient? Yes  SHORT TERM GOALS: Target date: 03/02/2022  Patient will be able to walk >/= 115ft with LRAD and no more than SPV in order to transition towards independence in the home and in the  community while minimizing risk of falls.  Baseline: ~163ft with SPC and CGA, (7/26): >162ft with SPC and CGA; (8/23): >100 ft with no AD and CGA; (9/13): >146ft with SPC and without SPC, SPV Goal status: MET   LONG TERM GOALS: Target date: 04/25/2022  Patient will be independent in the performance of a HEP in order to improve muscular strength, gait mechanics, and balance.  Baseline: seated hip flexion/toe/heel raises, (6/20) [id number], (7/26): continuing with seated toe/heel raises, standing marches at counter, STSs at counter Goal status: IN PROGRESS   2.  Patient will be able to improve 5TSTS score to </= 12 seconds in order to match age-predicted norms and reduce risk for falls in the home and community.  Baseline: 14 seconds, (7/25): 10.5 seconds  Goal status: MET  3. Patient will be able to walk IND >/= 136ft with LRAD  in order to demonstrate improved balance and carryover into mobility in the home and in the community. Baseline: unable to walk 173ft due to tolerance, (6/20): ~365ft no AD, CGA, (7/25): 365ft no AD, CGA  Goal status: IN PROGRESS   4. Patient will be able to improve gait speed on 10MWT to >/= 0.33m/s with LRAD to demonstrate improved LE strength, balance, and mobility in order to safely ambulate in the community without limitation.  Baseline: (7/26): .90m/s; (9/13): .7m/s  Goal status: IN PROGRESS   PLAN: PT Frequency: 1x/week  PT Duration: 10 weeks  Planned Interventions: Therapeutic exercises, Therapeutic activity, Neuromuscular re-education, Balance training, Gait training, Patient/Family education, Joint mobilization, Stair training, Moist heat, Taping, and Manual therapy  Plan For Next Session:   BP! warm-up, STSs with band, stand do band work, [employer], wall squats    [employer], PT, DPT  04/11/22, 2:31pm

## 2022-04-18 ENCOUNTER — Ambulatory Visit: Payer: Medicare Other | Attending: Neurology

## 2022-04-18 DIAGNOSIS — R293 Abnormal posture: Secondary | ICD-10-CM | POA: Diagnosis present

## 2022-04-18 DIAGNOSIS — R2689 Other abnormalities of gait and mobility: Secondary | ICD-10-CM | POA: Diagnosis not present

## 2022-04-18 NOTE — Therapy (Signed)
OUTPATIENT PHYSICAL THERAPY TREATMENT  Patient Name: Karen Frank MRN: [id number] DOB:[birth year], 76 y.o., female Today's Date: 04/18/2022   PT End of Session - 04/18/22 1439     Visit Number 15    Number of Visits 22    Date for PT Re-Evaluation 04/25/22    Authorization Type IE: 11/28/21, PN/RC: 02/28/22    PT Start Time 1445    PT Stop Time 1525    PT Time Calculation (min) 40 min    Equipment Utilized During Treatment Gait belt    Activity Tolerance Patient tolerated treatment well             No past medical history on file. No past surgical history on file. There are no problems to display for this patient.   PCP: Jordan Likes, MD  REFERRING PROVIDER: Vladimir Crofts, MD  REFERRING DIAG: R26.89 (ICD-10-CM) - Imbalance  THERAPY DIAG:  No diagnosis found.  Rationale for Evaluation and Treatment Rehabilitation  ONSET DATE: About a year ago                                                                                                                                                                                  PRECAUTIONS: None  WEIGHT BEARING RESTRICTIONS: No  FALLS:  Has patient fallen in last 6 months? No  OCCUPATION/SOCIAL ACTIVITIES: walking outside, activities around the home  PLOF: Independent with gait  PERTINENT HISTORY/CHART REVIEW: Internal medicine appointment 12/11/21 and neurology appointment 03/13/22   OBJECTIVE:   DIAGNOSTIC TESTING/IMPRESSIONS:  IMPRESSION: (06/22/21) 1. Ventriculomegaly, slightly progressed from 06/15/2014. This does not appear significantly disproportionate to the cerebral sulci. This however does not exclude the diagnosis of normal pressure hydrocephalus in the appropriate context. 2. Moderate findings of follow-up chronic small vessel disease as further detailed above. 3. Findings consistent with sequelae of remote microhemorrhage in the left posterior frontal lobe.  CT Lumbar spine without contrast  05/07/2021: 1. No acute fracture or listhesis. There is chronic-appearing anterolisthesis of L4 on L5. 2. 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    SUBJECTIVE:       Pt has been doing well and no significant changes from last session.   PAIN:  Are you having pain? No    OBJECTIVE:  POSTURE: (12/05/21) In standing, increased R shoulder height, increased rounded shoulders, forward head posture  Posterior pelvic tilt in sitting  Thoracic kyphosis: increased in standing and sitting Iliac crest height: L higher   GAIT: 11/28/21  Distance walked: ~122f Assistive device utilized: Use of SPC on/off  Level of assistance: CGA Comments: decreased step length, downward gaze (more probable due to impaired vision), decreased hip flexion B, poor clearance of  B feet (no LOB)    SENSATION: Light touch: Intact at L2-S2 dermatomes   LUMBAR AROM/PROM: Deferred 2/2 time constraints   AROM (Normal range in degrees) AROM   Flexion (65)   Extension (25-30)   Right lateral flexion (25)   Left lateral flexion (25)   Right rotation (30)   Left rotation (30)    (*= pain, Blank rows = not tested)  LOWER EXTREMITY ROM:   PROM (Normal range in degrees) Right 12/05/21 Left 12/05/21  Hip flexion (0-125) In sidelying (restricted) WFL  Hip extension (0-15)    Hip abduction (0-40) [facility] WFL  Hip adduction    Hip internal rotation (0-45)    Hip external rotation (0-45)    Knee flexion [facility]  Knee extension    Ankle dorsiflexion    Ankle plantarflexion    Ankle inversion    Ankle eversion     (*= pain, Blank rows = not tested)  LOWER EXTREMITY MMT:  MMT Right 11/27/21 Left 11/27/21 Left 02/28/22 Right 02/28/22  Hip flexion _0 Hip extension _1 Hip abduction 4  4    Hip adduction      Hip internal rotation      Hip external rotation      Knee flexion _2 Knee extension _3 Ankle dorsiflexion (in seated) _4 Ankle plantarflexion      Ankle inversion       Ankle eversion      (*= pain, Blank rows = not tested)   PHYSICAL PERFORMANCE MEASURES:  STS: Gower's sign and inability to control descent   RLE SLS:  LLE SLS: 10MWT: .59 m/s (limited community ambulator) (12/05/21)  5TSTS: 14 seconds (11/27/21)   TODAY'S TREATMENT  Neuromuscular Re-education: Gait training for warm-up:  -323f x 1 of gait with 1.5 lbs around ankles and no AD and SPV, minimal cueing required to increase R hip flexion to decrease foot drag RPE after - 8/10 and more fatigue in LEs Seated rest break with diaphragmatic breathing - Pt came back down to a 4/10 after rest   Balance activities:  -Airex with feet together, no use of UE, 15 secs x 3, CGA as Pt with some increased sway but no LOB   Standing foot taps x15, 2 sets, at stairs with 1.5 ankle weights and initial use single UE and SPV to challenge balance and improve control of hip flexion to aid in gait  Cueing required to avoid sliding of foot down from step  Pt then challenged with no use of BUE but required CGA due to increased lean laterally   STSs with red band for feedback to avoid knee valgus, for improved LE strength to aid in gait mechanics and endurance  x10 X5 without use of arms (crossed against chest)  Therapeutic Activity: BP check at beginning of session: 150/86 mmHg, 86 BPM   BP after 1st bout of gait  146/83 mmHg, 87 BPM    Patient response to interventions: Pt with 6/10 RPE at end of session    Patient Education:  Pt provided with HEP: STSs with no hands then STS with red band for feedback to decrease knee valgus. Patient educated throughout session on appropriate technique and form using multi-modal cueing, HEP, and activity modification. Patient will benefit from further education in order to maximize compliance and understanding for long-term therapeutic gains.    ASSESSMENT:  Clinical Impression:  Patient presents to clinic with excellent motivation to participate in today's  session. Pt continues to demonstrate deficits in gait, balance, LE muscle strength, ROM, endurance, and posture. Pt has been doing well and resting BP: 150/86 mmHg, 86 BPM. Pt reports taking BP medication but has to go pick up refills. Pt with improved gait (improved B hip flexion) and challenged with 1.5 lb ankle weights with no AD and CGA. Pt's balanced challenged without use of UE during step taps with 1.5 lb ankle weights only requiring CGA due to increased sway but no LOB. Pt able to demonstrate only requiring SPV with use of one UE on handrail. Pt responded well to all active and educational interventions ending session with 6/10 RPE and reports fatigue in BLEs. Pt will continue to benefit from benefit from skilled therapeutic intervention to address deficits in gait, balance, LE muscle strength, ROM, endurance, and posture in order to increase independence in the home and in the community as well as improve overall QOL.    Objective Impairments: Abnormal gait, decreased activity tolerance, decreased balance, decreased endurance, decreased mobility, difficulty walking, decreased strength, decreased safety awareness, and postural dysfunction.   Activity Limitations: carrying, sitting, standing, stairs, transfers, continence, and locomotion level  Personal Factors: Age, Past/current experiences, Time since onset of injury/illness/exacerbation, and 1-2 comorbidities: severe lumbar stenosis, ventriculomegaly are also affecting patient's functional outcome.   Rehab Potential: Good  Clinical Decision Making: Evolving/moderate complexity  Evaluation Complexity: Moderate   GOALS: Goals reviewed with patient? Yes  SHORT TERM GOALS: Target date: 03/02/2022  Patient will be able to walk >/= 170f with LRAD and no more than SPV in order to transition towards independence in the home and in the community while minimizing risk of falls.  Baseline: ~1076fwith SPC and CGA, (7/26): >10024fith SPC and  CGA; (8/23): >100 ft with no AD and CGA; (9/13): >100f31fth SPC and without SPC, SPV Goal status: MET   LONG TERM GOALS: Target date: 04/25/2022  Patient will be independent in the performance of a HEP in order to improve muscular strength, gait mechanics, and balance.  Baseline: seated hip flexion/toe/heel raises, (6/20) [id number], (7/26): continuing with seated toe/heel raises, standing marches at counter, STSs at counter Goal status: IN PROGRESS   2.  Patient will be able to improve 5TSTS score to </= 12 seconds in order to match age-predicted norms and reduce risk for falls in the home and community.  Baseline: 14 seconds, (7/25): 10.5 seconds  Goal status: MET  3. Patient will be able to walk IND >/= 150ft44fh LRAD  in order to demonstrate improved balance and carryover into mobility in the home and in the community. Baseline: unable to walk 150ft 58fto tolerance, (6/20): ~300ft n50f, CGA, (7/25): 300ft no64f CGA  Goal status: IN PROGRESS   4. Patient will be able to improve gait speed on 10MWT to >/= 0.13m/s wit67mRAD to demonstrate improved LE strength, balance, and mobility in order to safely ambulate in the community without limitation.  Baseline: (7/26): .39m/s; (980m: .16m/s  [id number]: IN PROGRESS   PLAN: PT Frequency: 1x/week  PT Duration: 10 weeks  Planned Interventions: Therapeutic exercises, Therapeutic activity, Neuromuscular re-education, Balance training, Gait training, Patient/Family education, Joint mobilization, Stair training, Moist heat, Taping, and Manual therapy  Plan For Next Session:   Reeval, BP! Gait speed, warm-up, stand do band work, pallof presSealed Air Corporationts    Juletta Berhe MonMarsh & McLennan11/2/23, 2:42pm

## 2022-04-24 ENCOUNTER — Other Ambulatory Visit: Payer: Self-pay | Admitting: Family Medicine

## 2022-04-24 DIAGNOSIS — R519 Headache, unspecified: Secondary | ICD-10-CM

## 2022-04-24 DIAGNOSIS — R42 Dizziness and giddiness: Secondary | ICD-10-CM

## 2022-04-25 ENCOUNTER — Ambulatory Visit: Payer: Medicare Other

## 2022-04-25 DIAGNOSIS — R2689 Other abnormalities of gait and mobility: Secondary | ICD-10-CM

## 2022-04-25 DIAGNOSIS — R293 Abnormal posture: Secondary | ICD-10-CM

## 2022-04-25 NOTE — Therapy (Signed)
OUTPATIENT PHYSICAL THERAPY RE-CERT  Patient Name: Karen Frank MRN: [id number] DOB:08-17-1945, 76 y.o., female Today's Date: 04/25/2022   PT End of Session - 04/25/22 1532     Visit Number 16    Number of Visits 22    Date for PT Re-Evaluation 06/06/22    Authorization Type IE: 11/28/21, PN/RC: 02/28/22; RC: 04/25/22    PT Start Time 1530    PT Stop Time 1610    PT Time Calculation (min) 40 min    Equipment Utilized During Treatment --    Activity Tolerance Patient tolerated treatment well             History reviewed. No pertinent past medical history. History reviewed. No pertinent surgical history. There are no problems to display for this patient.   PCP: Jordan Likes, MD  REFERRING PROVIDER: Vladimir Crofts, MD  REFERRING DIAG: R26.89 (ICD-10-CM) - Imbalance  THERAPY DIAG:  Impairment of balance  Other abnormalities of gait and mobility  Abnormal posture  Rationale for Evaluation and Treatment Rehabilitation  ONSET DATE: About a year ago                                                                                                                                                                                  PRECAUTIONS: None  WEIGHT BEARING RESTRICTIONS: No  FALLS:  Has patient fallen in last 6 months? No  OCCUPATION/SOCIAL ACTIVITIES: walking outside, activities around the home  PLOF: Independent with gait  PERTINENT HISTORY/CHART REVIEW: Internal medicine appointment 12/11/21 and neurology appointment 03/13/22   OBJECTIVE:   DIAGNOSTIC TESTING/IMPRESSIONS:  IMPRESSION: (06/22/21) 1. Ventriculomegaly, slightly progressed from 06/15/2014. This does not appear significantly disproportionate to the cerebral sulci. This however does not exclude the diagnosis of normal pressure hydrocephalus in the appropriate context. 2. Moderate findings of follow-up chronic small vessel disease as further detailed above. 3. Findings consistent with sequelae  of remote microhemorrhage in the left posterior frontal lobe.  CT Lumbar spine without contrast 05/07/2021: 1. No acute fracture or listhesis. There is chronic-appearing anterolisthesis of L4 on L5. 2. 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    SUBJECTIVE:       Pt has been doing well and no significant changes from last session.   PAIN:  Are you having pain? No    OBJECTIVE:  POSTURE: (12/05/21) In standing, increased R shoulder height, increased rounded shoulders, forward head posture  Posterior pelvic tilt in sitting  Thoracic kyphosis: increased in standing and sitting Iliac crest height: L higher   GAIT: 11/28/21  Distance walked: ~172f Assistive device utilized: Use of SPC on/off  Level of assistance: CGA Comments: decreased step length, downward gaze (more  probable due to impaired vision), decreased hip flexion B, poor clearance of B feet (no LOB)    SENSATION: Light touch: Intact at L2-S2 dermatomes   LUMBAR AROM/PROM: Deferred 2/2 time constraints   AROM (Normal range in degrees) AROM   Flexion (65)   Extension (25-30)   Right lateral flexion (25)   Left lateral flexion (25)   Right rotation (30)   Left rotation (30)    (*= pain, Blank rows = not tested)  LOWER EXTREMITY ROM:   PROM (Normal range in degrees) Right 12/05/21 Left 12/05/21  Hip flexion (0-125) In sidelying (restricted) WFL  Hip extension (0-15)    Hip abduction (0-40) [facility]  Hip adduction    Hip internal rotation (0-45)    Hip external rotation (0-45)    Knee flexion [facility]  Knee extension    Ankle dorsiflexion    Ankle plantarflexion    Ankle inversion    Ankle eversion     (*= pain, Blank rows = not tested)  LOWER EXTREMITY MMT:  MMT Right 11/27/21 Left 11/27/21 Left 02/28/22 Right 02/28/22  Hip flexion _0 Hip extension _1 Hip abduction 4  4    Hip adduction      Hip internal rotation      Hip external rotation      Knee flexion _2 Knee extension _3 Ankle dorsiflexion (in seated) _4 Ankle plantarflexion      Ankle inversion      Ankle eversion      (*= pain, Blank rows = not tested)   PHYSICAL PERFORMANCE MEASURES:  STS: Gower's sign and inability to control descent   RLE SLS:  LLE SLS: 10MWT: .59 m/s (limited community ambulator) (12/05/21)  5TSTS: 14 seconds (11/27/21)   TODAY'S TREATMENT  Neuromuscular Re-education: Reassessment:  Gait overall 275f - Pt changing b/w using SPC and not, improved shoulder height and R hip flexion with no cueing required  6/10 RPE  10MWT - .777m from September .5720m  Therapeutic Exercise: BP check at beginning of session: 156/88 mmHg, 77 BPM   BP after 1st bout of gait  154/85 mmHg, 79 BPM    Standing LE strength in abd/flex/ext for improved LE strengthening with RTB, cueing required for proper technique   Patient response to interventions: Pt reports LE fatigue after standing LE strength above    Patient Education:  Pt provided with HEP: LE strength with RTB. Patient educated throughout session on appropriate technique and form using multi-modal cueing, HEP, and activity modification. Patient will benefit from further education in order to maximize compliance and understanding for long-term therapeutic gains.    ASSESSMENT:  Clinical Impression: Patient presents to clinic with excellent motivation to participate in today's reassessment. Pt has continued to improve significantly in gait, posture, and balance. Pt able to perform 200f33f gait IND with no cueing and no use of AD. Pt reports use of SPC when she becomes more fatigued in the home. Pt has improved with R hip flexion during gait, demonstrates decreased trunk lean to the L, and decreased shoulder elevation. In addition, Pt's 10MWT has improved since September (.38m/73m .70 m/s), but continues to demonstrates decreased stride length B. Pt continues to be a limited community ambulator mostly due to LE fatigue and  LE endurance. Discussion on progression and how PT will continue to address deficits in ROM, balance, and  gait to avoid adverse events such as a fall especially on uneven ground or when the Pt is more fatigued as Pt lives alone. Pt verbalized understanding. BP stable over the course of the session today but slightly elevated than last week (156/85 mmHg). Pt reports continuing to take blood pressure medication. Pt will continue to benefit from from skilled therapeutic intervention to address deficits in gait, balance, LE muscle strength, ROM, and endurance in order to increase independence in the home and in the community as well as improve overall QOL.    Objective Impairments: Abnormal gait, decreased activity tolerance, decreased balance, decreased endurance, decreased mobility, difficulty walking, decreased strength, decreased safety awareness, and postural dysfunction.   Activity Limitations: carrying, sitting, standing, stairs, transfers, continence, and locomotion level  Personal Factors: Age, Past/current experiences, Time since onset of injury/illness/exacerbation, and 1-2 comorbidities: severe lumbar stenosis, ventriculomegaly are also affecting patient's functional outcome.   Rehab Potential: Good  Clinical Decision Making: Evolving/moderate complexity  Evaluation Complexity: Moderate   GOALS: Goals reviewed with patient? Yes  SHORT TERM GOALS: Target date: 03/02/2022  Patient will be able to walk >/= 169f with LRAD and no more than SPV in order to transition towards independence in the home and in the community while minimizing risk of falls.  Baseline: ~1059fwith SPC and CGA, (7/26): >10073fith SPC and CGA; (8/23): >100 ft with no AD and CGA; (9/13): >100f44fth SPC and without SPC, SPV Goal status: MET   LONG TERM GOALS: Target date: 06/06/2022   Patient will be independent in the performance of a HEP in order to improve muscular strength, gait mechanics, and balance.   Baseline: seated hip flexion/toe/heel raises, (6/20) [id number], (7/26): continuing with seated toe/heel raises, standing marches at counter, STSs at counter; (11/80: continues to be require cueing in remembering HEP  Goal status: IN PROGRESS   2.  Patient will be able to improve 5TSTS score to </= 12 seconds in order to match age-predicted norms and reduce risk for falls in the home and community.  Baseline: 14 seconds, (7/25): 10.5 seconds  Goal status: MET  3. Patient will be able to walk IND >/= 150ft53fh LRAD  in order to demonstrate improved balance and carryover into mobility in the home and in the community. Baseline: unable to walk 150ft 46fto tolerance, (6/20): ~300ft n34f, CGA, (7/25): 300ft no50f CGA; (11/8): Pt used SPC when she felt needed but IND with 200ft  Go60ftatus: MET  4. Patient will be able to improve gait speed on 10MWT to >/= 0.49m/s with85mAD to demonstrate improved LE strength, balance, and mobility in order to safely ambulate in the community without limitation.  Baseline: (7/26): .51m/s; (9/34m .65m/s, (11/849m.53m/s Goal s20ms: IN PROGRESS     PLAN: PT Frequency: 1x/week  PT Duration: 10 weeks  Planned Interventions: Therapeutic exercises, Therapeutic activity, Neuromuscular re-education, Balance training, Gait training, Patient/Family education, Joint mobilization, Stair training, Moist heat, Taping, and Manual therapy  Plan For Next Session:   BP! warm-up, outside walk, basketball hoop, step ups or practice stairs, stand do band work, pallof press,[employer]    Zariel Capano MontoMarsh & McLennan/8/23, 3:30pm

## 2022-04-27 ENCOUNTER — Other Ambulatory Visit: Payer: Medicare Other

## 2022-05-02 ENCOUNTER — Ambulatory Visit: Payer: Medicare Other

## 2022-05-02 DIAGNOSIS — R293 Abnormal posture: Secondary | ICD-10-CM

## 2022-05-02 DIAGNOSIS — R2689 Other abnormalities of gait and mobility: Secondary | ICD-10-CM

## 2022-05-02 NOTE — Therapy (Signed)
OUTPATIENT PHYSICAL THERAPY TREATMENT  Patient Name: Karen Frank MRN: [id number] DOB:1945-12-24, 76 y.o., female Today's Date: 05/02/2022   PT End of Session - 05/02/22 1504     Visit Number 17    Number of Visits 22    Date for PT Re-Evaluation 06/06/22    Authorization Type IE: 11/28/21, PN/RC: 02/28/22; RC: 04/25/22    PT Start Time 1507    PT Stop Time 1547    PT Time Calculation (min) 40 min    Activity Tolerance Patient tolerated treatment well             History reviewed. No pertinent past medical history. History reviewed. No pertinent surgical history. There are no problems to display for this patient.   PCP: Jordan Likes, MD  REFERRING PROVIDER: Vladimir Crofts, MD  REFERRING DIAG: R26.89 (ICD-10-CM) - Imbalance   THERAPY DIAG:  Impairment of balance  Other abnormalities of gait and mobility  Abnormal posture  Rationale for Evaluation and Treatment Rehabilitation  ONSET DATE: About a year ago                                                                                                                                                                                  PRECAUTIONS: None  WEIGHT BEARING RESTRICTIONS: No  FALLS:  Has patient fallen in last 6 months? No  OCCUPATION/SOCIAL ACTIVITIES: walking outside, activities around the home  PLOF: Independent with gait  PERTINENT HISTORY/CHART REVIEW: Internal medicine appointment 12/11/21 and neurology appointment 03/13/22   OBJECTIVE:   DIAGNOSTIC TESTING/IMPRESSIONS:  IMPRESSION: (06/22/21) 1. Ventriculomegaly, slightly progressed from 06/15/2014. This does not appear significantly disproportionate to the cerebral sulci. This however does not exclude the diagnosis of normal pressure hydrocephalus in the appropriate context. 2. Moderate findings of follow-up chronic small vessel disease as further detailed above. 3. Findings consistent with sequelae of remote microhemorrhage in the  left posterior frontal lobe.  CT Lumbar spine without contrast 05/07/2021: 1. No acute fracture or listhesis. There is chronic-appearing anterolisthesis of L4 on L5. 2. 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    SUBJECTIVE:       Pt has been doing well and walking outside. Pt has practiced newly added activities to HEP with no concern    PAIN:  Are you having pain? No    OBJECTIVE:  POSTURE: (12/05/21) In standing, increased R shoulder height, increased rounded shoulders, forward head posture  Posterior pelvic tilt in sitting  Thoracic kyphosis: increased in standing and sitting Iliac crest height: L higher   GAIT: 11/28/21  Distance walked: ~147f Assistive device utilized: Use of SPC on/off  Level of assistance: CGA Comments: decreased step length, downward gaze (  more probable due to impaired vision), decreased hip flexion B, poor clearance of B feet (no LOB)    SENSATION: Light touch: Intact at L2-S2 dermatomes   LUMBAR AROM/PROM: Deferred 2/2 time constraints   AROM (Normal range in degrees) AROM   Flexion (65)   Extension (25-30)   Right lateral flexion (25)   Left lateral flexion (25)   Right rotation (30)   Left rotation (30)    (*= pain, Blank rows = not tested)  LOWER EXTREMITY ROM:   PROM (Normal range in degrees) Right 12/05/21 Left 12/05/21  Hip flexion (0-125) In sidelying (restricted) WFL  Hip extension (0-15)    Hip abduction (0-40) [facility]  Hip adduction    Hip internal rotation (0-45)    Hip external rotation (0-45)    Knee flexion [facility]  Knee extension    Ankle dorsiflexion    Ankle plantarflexion    Ankle inversion    Ankle eversion     (*= pain, Blank rows = not tested)  LOWER EXTREMITY MMT:  MMT Right 11/27/21 Left 11/27/21 Left 02/28/22 Right 02/28/22  Hip flexion _0 Hip extension _1 Hip abduction 4  4    Hip adduction      Hip internal rotation      Hip external rotation      Knee flexion _2 Knee  extension _3 Ankle dorsiflexion (in seated) _4 Ankle plantarflexion      Ankle inversion      Ankle eversion      (*= pain, Blank rows = not tested)   PHYSICAL PERFORMANCE MEASURES:  STS: Gower's sign and inability to control descent   RLE SLS:  LLE SLS: 10MWT: .59 m/s (limited community ambulator) (12/05/21)  5TSTS: 14 seconds (11/27/21)   TODAY'S TREATMENT  Neuromuscular Re-education: Brief discussion on what pelvic floor physical therapy entails as Pt now voices concern on her urinary frequency and urinary incontinence as she wears briefs daily and nightly.   x3, gait on uneven surface (mat with weights underneath), requiring no AD and CGA  No LOB but cueing to increase B hip flexion  6/10 RPE at end of training   Obstacle training with step ups, step up on airex, gait on uneven surface, and weaving  Pt found step ups and step up on airex most difficult  Airex ball throw - one LOB but Pt demonstrated step response CGA required with increased fatigue observed in BLEs, more difficult on RLE when more weight placed to catch ball  7/10 RPE at end   Therapeutic Exercise: BP check at beginning of session: 150/85 mmHg, 72 BPM   BP at end of session 145/72 mmHg, 79 BPM    Patient response to interventions: Pt reports 7/10 at end of session    Patient Education:  Pt provided with HEP: no change. Patient educated throughout session on appropriate technique and form using multi-modal cueing, HEP, and activity modification. Patient will benefit from further education in order to maximize compliance and understanding for long-term therapeutic gains.    ASSESSMENT:  Clinical Impression: Patient presents to clinic with excellent motivation to participate in today's session. Pt continues to demonstrate deficits in gait, balance, LE muscle strength, ROM, and endurance. BP taken at beginning (150/85 mmgHg) and end of session, (145/72 mmHg). Focus on balance on uneven  surfaces today with variation of obstacles requiring CGA. Pt had most  difficult with step ups on 2in step and on airex but no LOB. During ball catch/throw on airex Pt did have no LOB with CGA but able to demonstrate step response with the LLE. Pt required cueing throughout balance and gait training to increase R hip flexion. Pt observed to be more hesitant with step ups. Will continue to practice in future sessions. Pt reports 7/10 RPE at end of session. Pt responded positively to active and educational interventions. Pt will continue to benefit from from skilled therapeutic intervention to address deficits in gait, balance, LE muscle strength, ROM, and endurance in order to increase independence in the home and in the community as well as improve overall QOL.    Objective Impairments: Abnormal gait, decreased activity tolerance, decreased balance, decreased endurance, decreased mobility, difficulty walking, decreased strength, decreased safety awareness, and postural dysfunction.   Activity Limitations: carrying, sitting, standing, stairs, transfers, continence, and locomotion level  Personal Factors: Age, Past/current experiences, Time since onset of injury/illness/exacerbation, and 1-2 comorbidities: severe lumbar stenosis, ventriculomegaly are also affecting patient's functional outcome.   Rehab Potential: Good  Clinical Decision Making: Evolving/moderate complexity  Evaluation Complexity: Moderate   GOALS: Goals reviewed with patient? Yes  SHORT TERM GOALS: Target date: 03/02/2022  Patient will be able to walk >/= 142f with LRAD and no more than SPV in order to transition towards independence in the home and in the community while minimizing risk of falls.  Baseline: ~108fwith SPC and CGA, (7/26): >10029fith SPC and CGA; (8/23): >100 ft with no AD and CGA; (9/13): >100f14fth SPC and without SPC, SPV Goal status: MET   LONG TERM GOALS: Target date: 06/06/2022   Patient will be  independent in the performance of a HEP in order to improve muscular strength, gait mechanics, and balance.  Baseline: seated hip flexion/toe/heel raises, (6/20) [id number], (7/26): continuing with seated toe/heel raises, standing marches at counter, STSs at counter; (11/80: continues to be require cueing in remembering HEP  Goal status: IN PROGRESS   2.  Patient will be able to improve 5TSTS score to </= 12 seconds in order to match age-predicted norms and reduce risk for falls in the home and community.  Baseline: 14 seconds, (7/25): 10.5 seconds  Goal status: MET  3. Patient will be able to walk IND >/= 150ft36fh LRAD  in order to demonstrate improved balance and carryover into mobility in the home and in the community. Baseline: unable to walk 150ft 22fto tolerance, (6/20): ~300ft n68f, CGA, (7/25): 300ft no65f CGA; (11/8): Pt used SPC when she felt needed but IND with 200ft  Go58ftatus: MET  4. Patient will be able to improve gait speed on 10MWT to >/= 0.71m/s with49mAD to demonstrate improved LE strength, balance, and mobility in order to safely ambulate in the community without limitation.  Baseline: (7/26): .41m/s; (9/4m .108m/s, (11/40m.78m/s Goal s44ms: IN PROGRESS     PLAN: PT Frequency: 1x/week  PT Duration: 10 weeks  Planned Interventions: Therapeutic exercises, Therapeutic activity, Neuromuscular re-education, Balance training, Gait training, Patient/Family education, Joint mobilization, Stair training, Moist heat, Taping, and Manual therapy  Plan For Next Session: BP! warm-up, outside walk, basketball hoop, step ups or practice stairs, stand do band work, pallof press,[employer]    Marcel Gary [employer]/8/23, 3:30pm

## 2022-05-09 ENCOUNTER — Ambulatory Visit: Payer: Medicare Other

## 2022-05-09 DIAGNOSIS — R293 Abnormal posture: Secondary | ICD-10-CM

## 2022-05-09 DIAGNOSIS — R2689 Other abnormalities of gait and mobility: Secondary | ICD-10-CM | POA: Diagnosis not present

## 2022-05-09 NOTE — Therapy (Signed)
OUTPATIENT PHYSICAL THERAPY TREATMENT  Patient Name: Karen Frank MRN: [id number] DOB:1946/02/27, 76 y.o., female Today's Date: 05/09/2022   PT End of Session - 05/09/22 1533     Visit Number 18    Number of Visits 22    Date for PT Re-Evaluation 06/06/22    Authorization Type IE: 11/28/21, PN/RC: 02/28/22; RC: 04/25/22    PT Start Time 1535    PT Stop Time 1615    PT Time Calculation (min) 40 min    Equipment Utilized During Treatment Gait belt    Activity Tolerance Patient tolerated treatment well             History reviewed. No pertinent past medical history. History reviewed. No pertinent surgical history. There are no problems to display for this patient.   PCP: Jordan Likes, MD  REFERRING PROVIDER: Vladimir Crofts, MD  REFERRING DIAG: R26.89 (ICD-10-CM) - Imbalance   THERAPY DIAG:  Impairment of balance  Other abnormalities of gait and mobility  Abnormal posture  Rationale for Evaluation and Treatment Rehabilitation  ONSET DATE: About a year ago                                                                                                                                                                                  PRECAUTIONS: None  WEIGHT BEARING RESTRICTIONS: No  FALLS:  Has patient fallen in last 6 months? No  OCCUPATION/SOCIAL ACTIVITIES: walking outside, activities around the home  PLOF: Independent with gait  PERTINENT HISTORY/CHART REVIEW: Internal medicine appointment 12/11/21 and neurology appointment 03/13/22   OBJECTIVE:   DIAGNOSTIC TESTING/IMPRESSIONS:  IMPRESSION: (06/22/21) 1. Ventriculomegaly, slightly progressed from 06/15/2014. This does not appear significantly disproportionate to the cerebral sulci. This however does not exclude the diagnosis of normal pressure hydrocephalus in the appropriate context. 2. Moderate findings of follow-up chronic small vessel disease as further detailed above. 3. Findings consistent  with sequelae of remote microhemorrhage in the left posterior frontal lobe.  CT Lumbar spine without contrast 05/07/2021: 1. No acute fracture or listhesis. There is chronic-appearing anterolisthesis of L4 on L5. 2. 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    SUBJECTIVE:       Pt has felt like she has not been walking well but reports this almost every session. Pt reports "having 2 really good days" then I was tired.    PAIN:  Are you having pain? No    OBJECTIVE:  POSTURE: (12/05/21) In standing, increased R shoulder height, increased rounded shoulders, forward head posture  Posterior pelvic tilt in sitting  Thoracic kyphosis: increased in standing and sitting Iliac crest height: L higher   GAIT: 11/28/21  Distance walked: ~154f Assistive  device utilized: Use of SPC on/off  Level of assistance: CGA Comments: decreased step length, downward gaze (more probable due to impaired vision), decreased hip flexion B, poor clearance of B feet (no LOB)    SENSATION: Light touch: Intact at L2-S2 dermatomes   LUMBAR AROM/PROM: Deferred 2/2 time constraints   AROM (Normal range in degrees) AROM   Flexion (65)   Extension (25-30)   Right lateral flexion (25)   Left lateral flexion (25)   Right rotation (30)   Left rotation (30)    (*= pain, Blank rows = not tested)  LOWER EXTREMITY ROM:   PROM (Normal range in degrees) Right 12/05/21 Left 12/05/21  Hip flexion (0-125) In sidelying (restricted) WFL  Hip extension (0-15)    Hip abduction (0-40) [facility]  Hip adduction    Hip internal rotation (0-45)    Hip external rotation (0-45)    Knee flexion [facility]  Knee extension    Ankle dorsiflexion    Ankle plantarflexion    Ankle inversion    Ankle eversion     (*= pain, Blank rows = not tested)  LOWER EXTREMITY MMT:  MMT Right 11/27/21 Left 11/27/21 Left 02/28/22 Right 02/28/22  Hip flexion _0 Hip extension _1 Hip abduction 4  4    Hip adduction      Hip  internal rotation      Hip external rotation      Knee flexion _2 Knee extension _3 Ankle dorsiflexion (in seated) _4 Ankle plantarflexion      Ankle inversion      Ankle eversion      (*= pain, Blank rows = not tested)   PHYSICAL PERFORMANCE MEASURES:  STS: Gower's sign and inability to control descent   RLE SLS:  LLE SLS: 10MWT: .59 m/s (limited community ambulator) (12/05/21)  5TSTS: 14 seconds (11/27/21)   TODAY'S TREATMENT  Neuromuscular Re-education: Standing SLS with use of one UE and SPV, no LOB, for improved balance especially out in the community while climbing curbs or obstacles around the home Short standing rest breaks in between required   Steps ups to encourage SLS and hip flexion to aid in gait mechanics, CGA and no AD or UE support  8/10 RPE after first bout, seated rest breaks in between x10 reps for each bout  No LOB  Forward/lateral Lateral sidestepping with 1 in stepping up with R more difficult than L. Unable to lift up fully on 1 in step - CGA required to maintain balance Cueing to maintain eccentric control while descending step and increased hip flexion   Gait x 275f for cool down with SPV, cueing for increased stride length B with audio metronome   Therapeutic Exercise: BP check at beginning of session: 156/74 mmHg, 85 BPM   BP at end of session 155/95 mmHg, 88 BPM   Patient response to interventions: Pt reports 7/10 at end of session    Patient Education:  Pt provided with HEP: no change. Patient educated throughout session on appropriate technique and form using multi-modal cueing, HEP, and activity modification. Patient will benefit from further education in order to maximize compliance and understanding for long-term therapeutic gains.    ASSESSMENT:  Clinical Impression: Patient presents to clinic with excellent motivation to participate in today's session. Pt continues to demonstrate deficits in gait, balance, LE  muscle strength, ROM, and endurance. BP taken  at beginning (156/74 mmgHg) and end of session, (155/95 mmHg). Focus on balance with emphasis on SLS to aid in gait mechanics and to navigate inside the home and in the community. Pt required use of one UE during SLS and SPV. Pt unable to demonstrate SLS without UE support. Pt required CGA with sidestepping and steps up on 2 in rise with no LOB but demonstrates more difficulty with stepping up with RLE. DPT tried 1 in rise lateral step ups with RLE and Pt unable to hold up LLE without CGA. Will continue to challenge balance in future sessions. Pt reports 7/10 RPE at end of session and up to 8/10 with step up and lateral step ups. Pt responded positively to active and educational interventions. Pt will continue to benefit from from skilled therapeutic intervention to address deficits in gait, balance, LE muscle strength, ROM, and endurance in order to increase independence in the home and in the community as well as improve overall QOL.    Objective Impairments: Abnormal gait, decreased activity tolerance, decreased balance, decreased endurance, decreased mobility, difficulty walking, decreased strength, decreased safety awareness, and postural dysfunction.   Activity Limitations: carrying, sitting, standing, stairs, transfers, continence, and locomotion level  Personal Factors: Age, Past/current experiences, Time since onset of injury/illness/exacerbation, and 1-2 comorbidities: severe lumbar stenosis, ventriculomegaly are also affecting patient's functional outcome.   Rehab Potential: Good  Clinical Decision Making: Evolving/moderate complexity  Evaluation Complexity: Moderate   GOALS: Goals reviewed with patient? Yes  SHORT TERM GOALS: Target date: 03/02/2022  Patient will be able to walk >/= 160f with LRAD and no more than SPV in order to transition towards independence in the home and in the community while minimizing risk of falls.  Baseline:  ~1033fwith SPC and CGA, (7/26): >10047fith SPC and CGA; (8/23): >100 ft with no AD and CGA; (9/13): >100f50fth SPC and without SPC, SPV Goal status: MET   LONG TERM GOALS: Target date: 06/06/2022   Patient will be independent in the performance of a HEP in order to improve muscular strength, gait mechanics, and balance.  Baseline: seated hip flexion/toe/heel raises, (6/20) [id number], (7/26): continuing with seated toe/heel raises, standing marches at counter, STSs at counter; (11/80: continues to be require cueing in remembering HEP  Goal status: IN PROGRESS   2.  Patient will be able to improve 5TSTS score to </= 12 seconds in order to match age-predicted norms and reduce risk for falls in the home and community.  Baseline: 14 seconds, (7/25): 10.5 seconds  Goal status: MET  3. Patient will be able to walk IND >/= 150ft71fh LRAD  in order to demonstrate improved balance and carryover into mobility in the home and in the community. Baseline: unable to walk 150ft 40fto tolerance, (6/20): ~300ft n16f, CGA, (7/25): 300ft no35f CGA; (11/8): Pt used SPC when she felt needed but IND with 200ft  Go53ftatus: MET  4. Patient will be able to improve gait speed on 10MWT to >/= 0.14m/s with41mAD to demonstrate improved LE strength, balance, and mobility in order to safely ambulate in the community without limitation.  Baseline: (7/26): .83m/s; (9/82m .40m/s, (11/42m.40m/s Goal s40ms: IN PROGRESS     PLAN: PT Frequency: 1x/week  PT Duration: 10 weeks  Planned Interventions: Therapeutic exercises, Therapeutic activity, Neuromuscular re-education, Balance training, Gait training, Patient/Family education, Joint mobilization, Stair training, Moist heat, Taping, and Manual therapy  Plan For Next Session: BP! Metronome gait, basketball hoop, step ups or practice  stairs, stand do band work, [employer], wall squats    [employer], PT, DPT  05/09/22, 3:35pm

## 2022-05-15 ENCOUNTER — Ambulatory Visit
Admission: RE | Admit: 2022-05-15 | Discharge: 2022-05-15 | Disposition: A | Discharge status: Home / Self Care | Payer: Medicare Other | Source: Ambulatory Visit | Attending: Family Medicine | Admitting: Family Medicine

## 2022-05-15 ENCOUNTER — Ambulatory Visit: Payer: Medicare Other

## 2022-05-15 DIAGNOSIS — R519 Headache, unspecified: Secondary | ICD-10-CM | POA: Diagnosis present

## 2022-05-15 DIAGNOSIS — R42 Dizziness and giddiness: Secondary | ICD-10-CM | POA: Insufficient documentation

## 2022-05-30 ENCOUNTER — Ambulatory Visit: Payer: Medicare Other

## 2022-06-04 ENCOUNTER — Telehealth: Payer: Self-pay

## 2022-06-04 NOTE — Telephone Encounter (Signed)
Spoke with Dr. Hosie Poisson office at [facility] for pelvic floor physical therapy referral. They asked for phone and fax number and will pass along to physician as soon as possible.

## 2022-06-05 ENCOUNTER — Ambulatory Visit: Payer: Medicare Other | Attending: Neurology

## 2022-06-05 DIAGNOSIS — R293 Abnormal posture: Secondary | ICD-10-CM

## 2022-06-05 DIAGNOSIS — R2689 Other abnormalities of gait and mobility: Secondary | ICD-10-CM

## 2022-06-05 NOTE — Therapy (Addendum)
OUTPATIENT PHYSICAL THERAPY TREATMENT  Patient Name: Karen Frank MRN: [id number] DOB:1945/10/19, 76 y.o., female Today's Date: 06/05/2022   PT End of Session - 06/05/22 1447     Visit Number 19    Number of Visits 22    Date for PT Re-Evaluation 07/17/22    Authorization Type IE: 11/28/21, PN/RC: 02/28/22; RC: 04/25/22    PT Start Time 1445    PT Stop Time 1500    PT Time Calculation (min) 15 min    Equipment Utilized During Treatment --    Activity Tolerance --             No past medical history on file. No past surgical history on file. There are no problems to display for this patient.   PCP: Jordan Likes, MD  REFERRING PROVIDER: Vladimir Crofts, MD  REFERRING DIAG: R26.89 (ICD-10-CM) - Imbalance   THERAPY DIAG:  Impairment of balance  Other abnormalities of gait and mobility  Abnormal posture  Rationale for Evaluation and Treatment Rehabilitation  ONSET DATE: About a year ago                                                                                                                                                                                  PRECAUTIONS: None  WEIGHT BEARING RESTRICTIONS: No  FALLS:  Has patient fallen in last 6 months? No  OCCUPATION/SOCIAL ACTIVITIES: walking outside, activities around the home  PLOF: Independent with gait  PERTINENT HISTORY/CHART REVIEW: Internal medicine appointment 12/11/21 and neurology appointment 03/13/22   OBJECTIVE:   DIAGNOSTIC TESTING/IMPRESSIONS:  IMPRESSION: (06/22/21) 1. Ventriculomegaly, slightly progressed from 06/15/2014. This does not appear significantly disproportionate to the cerebral sulci. This however does not exclude the diagnosis of normal pressure hydrocephalus in the appropriate context. 2. Moderate findings of follow-up chronic small vessel disease as further detailed above. 3. Findings consistent with sequelae of remote microhemorrhage in the left posterior frontal  lobe.  CT Lumbar spine without contrast 05/07/2021: 1. No acute fracture or listhesis. There is chronic-appearing anterolisthesis of L4 on L5. 2. 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    SUBJECTIVE:       Pt took a fall on 05/28/22 in her home falling on the L knee. Pt denies hitting head or bottom. Pt able to get back up on her own and did not seek further medical attention. Pt has had trouble walking the past week (using her SPC) mainly due to discomfort at the knee and some swelling.    PAIN:  Are you having pain? Yes NPRS: L knee    OBJECTIVE:  POSTURE: (12/05/21) In standing, increased R shoulder height, increased rounded shoulders, forward head posture  Posterior pelvic tilt in sitting  Thoracic kyphosis: increased in standing and sitting Iliac crest height: L higher   GAIT: 11/28/21  Distance walked: ~110f Assistive device utilized: Use of SPC on/off  Level of assistance: CGA Comments: decreased step length, downward gaze (more probable due to impaired vision), decreased hip flexion B, poor clearance of B feet (no LOB)    SENSATION: Light touch: Intact at L2-S2 dermatomes   LUMBAR AROM/PROM: Deferred 2/2 time constraints   AROM (Normal range in degrees) AROM   Flexion (65)   Extension (25-30)   Right lateral flexion (25)   Left lateral flexion (25)   Right rotation (30)   Left rotation (30)    (*= pain, Blank rows = not tested)  LOWER EXTREMITY ROM:   PROM (Normal range in degrees) Right 12/05/21 Left 12/05/21  Hip flexion (0-125) In sidelying (restricted) WFL  Hip extension (0-15)    Hip abduction (0-40) [facility] Hip adduction    Hip internal rotation (0-45)    Hip external rotation (0-45)    Knee flexion [facility] Knee extension    Ankle dorsiflexion    Ankle plantarflexion    Ankle inversion    Ankle eversion     (*= pain, Blank rows = not tested)  LOWER EXTREMITY MMT:  MMT Right 11/27/21 Left 11/27/21 Left 02/28/22 Right 02/28/22  Hip  flexion _0 Hip extension _1 Hip abduction 4  4    Hip adduction      Hip internal rotation      Hip external rotation      Knee flexion _2 Knee extension _3 Ankle dorsiflexion (in seated) _4 Ankle plantarflexion      Ankle inversion      Ankle eversion      (*= pain, Blank rows = not tested)   PHYSICAL PERFORMANCE MEASURES:  STS: Gower's sign and inability to control descent   RLE SLS:  LLE SLS: 10MWT: .59 m/s (limited community ambulator) (12/05/21)  5TSTS: 14 seconds (11/27/21)   TODAY'S TREATMENT  None - Educated on use of ice modality, rest/elevation, and continued movement of L hip to avoid stiffness and increased pain   Patient response to interventions: Pt will decided by next session if she would like to continue with balance PT or switch to PFPT for urinary incontinence    Patient Education:  Pt provided with HEP: no change. Patient educated throughout session on appropriate technique and form using multi-modal cueing, HEP, and activity modification. Patient will benefit from further education in order to maximize compliance and understanding for long-term therapeutic gains.    ASSESSMENT:  Clinical Impression: Patient presents to clinic with reports of a fall on 05/28/22. Pt reports no falling on the head or the bottom. Pt fell onto L knee and able to stand within seconds though with increased pain. DPT advised Pt if pain continues to worsen or unable to place pressure on LLE to seek medical attention at the ED or nearest urgent care. Pt denies any lightheadedness/dizziness since fall and per Pt has been taking BP in the home. Brief education on use of ice modality, rest/elevation, movement, and removing fall hazards. Pt verbalized understanding. Pt will decide if she would like to continue with physical therapy for balance impairments (re-eval required) or switch to PFPT. Pt will continue to benefit from from skilled therapeutic  intervention to address deficits  in gait, balance, LE muscle strength, ROM, and endurance in order to increase independence in the home and in the community as well as improve overall QOL.    Objective Impairments: Abnormal gait, decreased activity tolerance, decreased balance, decreased endurance, decreased mobility, difficulty walking, decreased strength, decreased safety awareness, and postural dysfunction.   Activity Limitations: carrying, sitting, standing, stairs, transfers, continence, and locomotion level  Personal Factors: Age, Past/current experiences, Time since onset of injury/illness/exacerbation, and 1-2 comorbidities: severe lumbar stenosis, ventriculomegaly are also affecting patient's functional outcome.   Rehab Potential: Good  Clinical Decision Making: Evolving/moderate complexity  Evaluation Complexity: Moderate   GOALS: Goals reviewed with patient? Yes  SHORT TERM GOALS: Target date: 03/02/2022  Patient will be able to walk >/= 161f with LRAD and no more than SPV in order to transition towards independence in the home and in the community while minimizing risk of falls.  Baseline: ~1085fwith SPC and CGA, (7/26): >10065fith SPC and CGA; (8/23): >100 ft with no AD and CGA; (9/13): >100f41fth SPC and without SPC, SPV Goal status: MET   LONG TERM GOALS: Target date: 06/06/2022   Patient will be independent in the performance of a HEP in order to improve muscular strength, gait mechanics, and balance.  Baseline: seated hip flexion/toe/heel raises, (6/20) [id number], (7/26): continuing with seated toe/heel raises, standing marches at counter, STSs at counter; (11/80: continues to be require cueing in remembering HEP  Goal status: IN PROGRESS   2.  Patient will be able to improve 5TSTS score to </= 12 seconds in order to match age-predicted norms and reduce risk for falls in the home and community.  Baseline: 14 seconds, (7/25): 10.5 seconds  Goal status: MET  3.  Patient will be able to walk IND >/= 150ft11fh LRAD  in order to demonstrate improved balance and carryover into mobility in the home and in the community. Baseline: unable to walk 150ft 52fto tolerance, (6/20): ~300ft n71f, CGA, (7/25): 300ft no91f CGA; (11/8): Pt used SPC when she felt needed but IND with 200ft  Go64ftatus: MET  4. Patient will be able to improve gait speed on 10MWT to >/= 0.28m/s with37mAD to demonstrate improved LE strength, balance, and mobility in order to safely ambulate in the community without limitation.  Baseline: (7/26): .17m/s; (9/42m .30m/s, (11/34m.6m/s Goal s15ms: IN PROGRESS     PLAN: PT Frequency: 1x/week  PT Duration: 10 weeks  Planned Interventions: Therapeutic exercises, Therapeutic activity, Neuromuscular re-education, Balance training, Gait training, Patient/Family education, Joint mobilization, Stair training, Moist heat, Taping, and Manual therapy  Plan For Next Session: re-eval, BP! Metronome gait   Yareli Carthen [employer]/19/23, 2:43pm

## 2022-06-14 ENCOUNTER — Ambulatory Visit: Payer: Medicare Other

## 2022-06-26 ENCOUNTER — Ambulatory Visit: Payer: Medicare Other

## 2022-07-03 ENCOUNTER — Ambulatory Visit: Payer: Medicare Other | Attending: Neurology

## 2022-07-04 ENCOUNTER — Ambulatory Visit: Payer: Medicare Other

## 2022-07-05 ENCOUNTER — Telehealth: Payer: Self-pay

## 2022-07-05 ENCOUNTER — Ambulatory Visit: Payer: Medicare Other

## 2022-07-05 NOTE — Telephone Encounter (Signed)
PT contacted pt via secure phone line and left VM due to no-show today. Pt has not been to clinic since 06/05/2022 with other no-shows. PT informed pt she will need to seek new orders from her physician if she would like to return to PT at this time. PT left clinic's contact information should pt have questions.  Ricard Dillon PT, DPT

## 2022-07-10 ENCOUNTER — Ambulatory Visit: Payer: Medicare Other

## 2022-07-17 ENCOUNTER — Ambulatory Visit: Payer: Medicare Other

## 2022-07-19 ENCOUNTER — Ambulatory Visit: Payer: Medicare Other

## 2022-07-24 ENCOUNTER — Ambulatory Visit: Payer: Medicare Other

## 2022-07-26 ENCOUNTER — Ambulatory Visit: Payer: Medicare Other

## 2022-07-31 ENCOUNTER — Ambulatory Visit: Payer: Medicare Other

## 2022-08-02 ENCOUNTER — Ambulatory Visit: Payer: Medicare Other

## 2022-08-07 ENCOUNTER — Ambulatory Visit: Payer: Medicare Other

## 2022-08-09 ENCOUNTER — Ambulatory Visit: Payer: Medicare Other

## 2022-08-14 ENCOUNTER — Ambulatory Visit: Payer: Medicare Other

## 2022-08-16 ENCOUNTER — Ambulatory Visit: Payer: Medicare Other

## 2022-08-21 ENCOUNTER — Ambulatory Visit: Payer: Medicare Other

## 2022-08-23 ENCOUNTER — Ambulatory Visit: Payer: Medicare Other

## 2022-08-28 ENCOUNTER — Ambulatory Visit: Payer: Medicare Other

## 2022-08-30 ENCOUNTER — Ambulatory Visit: Payer: Medicare Other

## 2022-09-04 ENCOUNTER — Ambulatory Visit: Payer: Medicare Other

## 2022-09-06 ENCOUNTER — Ambulatory Visit: Payer: Medicare Other

## 2022-09-11 ENCOUNTER — Ambulatory Visit: Payer: Medicare Other

## 2022-09-13 ENCOUNTER — Ambulatory Visit: Payer: Medicare Other

## 2022-09-18 ENCOUNTER — Ambulatory Visit: Payer: Medicare Other

## 2022-09-20 ENCOUNTER — Ambulatory Visit: Payer: Medicare Other

## 2022-09-25 ENCOUNTER — Ambulatory Visit: Payer: Medicare Other

## 2022-09-27 ENCOUNTER — Ambulatory Visit: Payer: Medicare Other

## 2022-10-02 ENCOUNTER — Ambulatory Visit: Payer: Medicare Other

## 2022-10-04 ENCOUNTER — Ambulatory Visit: Payer: Medicare Other

## 2022-10-09 ENCOUNTER — Ambulatory Visit: Payer: Medicare Other

## 2022-10-11 ENCOUNTER — Ambulatory Visit: Payer: Medicare Other

## 2022-10-16 ENCOUNTER — Ambulatory Visit: Payer: Medicare Other

## 2022-10-18 ENCOUNTER — Ambulatory Visit: Payer: Medicare Other

## 2022-10-22 ENCOUNTER — Ambulatory Visit: Payer: Medicare Other | Admitting: Physical Therapy

## 2022-10-23 ENCOUNTER — Ambulatory Visit: Payer: Medicare Other

## 2022-10-25 ENCOUNTER — Ambulatory Visit: Payer: Medicare Other

## 2022-10-30 ENCOUNTER — Ambulatory Visit: Payer: Medicare Other

## 2022-10-31 ENCOUNTER — Ambulatory Visit: Payer: Medicare Other

## 2022-11-01 ENCOUNTER — Ambulatory Visit: Payer: Medicare Other

## 2022-11-05 ENCOUNTER — Ambulatory Visit: Payer: Medicare Other

## 2022-11-06 ENCOUNTER — Ambulatory Visit: Payer: Medicare Other

## 2022-11-07 ENCOUNTER — Ambulatory Visit: Payer: Medicare Other

## 2022-11-08 ENCOUNTER — Ambulatory Visit: Payer: Medicare Other

## 2022-11-13 ENCOUNTER — Ambulatory Visit: Payer: Medicare Other

## 2022-11-13 NOTE — Therapy (Signed)
OUTPATIENT PHYSICAL THERAPY NEURO EVALUATION   Patient Name: Shaniqua Huyser MRN: [id number] DOB:1946-05-18, 77 y.o., female Today's Date: 11/15/2022   PCP: Wilford Corner PA REFERRING PROVIDER: Wilford Corner, PA  END OF SESSION:  PT End of Session - 11/15/22 0746     Visit Number 1    Number of Visits 24    Date for PT Re-Evaluation 02/07/23    Authorization Type 1/10 eval 11/14/22    PT Start Time 1600    PT Stop Time 1645    PT Time Calculation (min) 45 min    Equipment Utilized During Treatment Gait belt    Activity Tolerance Patient tolerated treatment well;Patient limited by fatigue    Behavior During Therapy [facility] for tasks assessed/performed             History reviewed. No pertinent past medical history. History reviewed. No pertinent surgical history. There are no problems to display for this patient.   ONSET DATE: February 2024  REFERRING DIAG: imbalance  THERAPY DIAG:  Unsteadiness on feet  Difficulty in walking, not elsewhere classified  Muscle weakness (generalized)  Rationale for Evaluation and Treatment: Rehabilitation  SUBJECTIVE:                                                                                                                                                                                             SUBJECTIVE STATEMENT: Patient presents to PT for evaluation for imbalance.  Pt accompanied by: self  PERTINENT HISTORY: Patient is returning back to PT for balance deficits. She has been seen in this clinic in the past, was discharged due to no shows. Patient had a fall in November 2023. Uses a cane for ambulation of short distance.   PAIN:  Are you having pain? No  PRECAUTIONS: Fall  WEIGHT BEARING RESTRICTIONS: No  FALLS: Has patient fallen in last 6 months? Yes. Number of falls 1  LIVING ENVIRONMENT: Lives with: lives alone Lives in: House/apartment Stairs: No Has following equipment at home: Single  point cane, Walker - 2 wheeled, and shower chair  PLOF: Independent with basic ADLs  PATIENT GOALS: walk again, be able to go for long walks again  OBJECTIVE:   DIAGNOSTIC FINDINGS:  IMPRESSION: No acute intracranial abnormality.   IMPRESSION: (06/22/21) 1. Ventriculomegaly, slightly progressed from 06/15/2014. This does not appear significantly disproportionate to the cerebral sulci. This however does not exclude the diagnosis of normal pressure hydrocephalus in the appropriate context. 2. Moderate findings of follow-up chronic small vessel disease as further detailed above. 3. Findings consistent with sequelae of remote microhemorrhage in the left posterior frontal lobe.  CT Lumbar spine without contrast 05/07/2021: 1. No acute fracture or listhesis. There is chronic-appearing anterolisthesis of L4 on L5. 2. 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    COGNITION: Overall cognitive status: Within functional limits for tasks assessed   SENSATION: WFL  COORDINATION: Heel shin test: WFL Nose finger: decreased L; WFL    POSTURE: rounded shoulders and forward head    LOWER EXTREMITY MMT:    MMT Right Eval Left Eval  Hip flexion 4 4+  Hip extension    Hip abduction 3+ 3+  Hip adduction 3 4-  Hip internal rotation    Hip external rotation    Knee flexion 3+ 3+  Knee extension 4- 4  Ankle dorsiflexion 4- 4-  Ankle plantarflexion 4- 4-  Ankle inversion    Ankle eversion    (Blank rows = not tested)  BED MOBILITY:  Patient reports independent  TRANSFERS: Assistive device utilized:  hurrycane   Sit to stand: CGA Stand to sit: CGA Chair to chair: CGA   RAMP:  *address in future session  CURB:  *address in future session    GAIT: Gait pattern: decreased arm swing- Right, decreased arm swing- Left, decreased step length- Right, decreased step length- Left, decreased stride length, decreased hip/knee flexion- Right, decreased hip/knee flexion- Left, poor  foot clearance- Right, and poor foot clearance- Left Distance walked: ~50' w/ CGA and Hurry [employer] device utilized:  Hurry cane Level of assistance: CGA Comments: Pt held [employer] in [employer], PT educated pt on proper technique for increased safety, and advised pt to switch cane to L side.    FUNCTIONAL TESTS:  5 times sit to stand: 11.1 sec 6 minute walk test: 255' in 2\' 51"  w/ Hurry Cane in L hand and CGA 10 meter walk test: 0.64 w/ Hurry Cane in R hand and CGA (see above)   Berg Balance Scale: 32/56  PATIENT SURVEYS:  FOTO 44, predicted d/c 52  TODAY'S TREATMENT:                                                                                                                              DATE: 11/15/22 Eval only    PATIENT EDUCATION: Education details: goals, Pt educated on the purpose of PT, safety considerations Person educated: Patient Education method: Explanation, Demonstration, Tactile cues, and Verbal cues Education comprehension: verbalized understanding, returned demonstration, verbal cues required, tactile cues required, and needs further education  HOME EXERCISE PROGRAM: Give next session   GOALS: Goals reviewed with patient? Yes  SHORT TERM GOALS: Target date: 12/13/2022   Patient will be independent in home exercise program to improve strength/mobility for better functional independence with ADLs. Baseline:give next session  Goal status: INITIAL    LONG TERM GOALS: Target date: 02/07/2023  Patient will increase FOTO score to equal to or greater than 52 %   to demonstrate statistically significant improvement in mobility and quality of life.  Baseline: 5/29: 49%  Goal status: INITIAL  2.  Patient will increase Berg Balance score by > 6 points to demonstrate decreased fall risk during functional activities Baseline: 5/29: 32/56  Goal status: INITIAL  3.  Patient will increase 10 meter walk test to >1.3m/s as to improve gait speed for better  community ambulation and to reduce fall risk. Baseline: 5/29: 0.64 w/ Hurry Cane in R hand and CGA  Goal status: INITIAL  4.  Patient will increase six minute walk test distance to >1000 for progression to community ambulator and improve gait ability Baseline: 255' in 2\' 51"  w/ Hurry Cane in L hand and CGA Goal status: INITIAL    ASSESSMENT:  CLINICAL IMPRESSION: Patient is a 77 y.o. F who was seen today for physical therapy evaluation and treatment for imbalance. Patient has been seen in the past at this clinic for pelvic therapy. She presents with weakness of RLE> LLE and requires education on utilization of SPC in correct hand for strength deficit. She fatigues quickly in standing resulting in instability and limited capacity for functional mobility.  Patient demonstrates decreased in addition to decreased stance phase on right lower extremity.  Her 6-minute walk test is unable to be fully completed due to fatigue with increased compensatory gait mechanics as patient progressed.  Patient is only able to ambulate 2 minutes and 51 seconds with use of a hurricane prior to needing to rest and being unable to finish rest of the test.  Patient's balance is limited as can be seen in Berg balance test she is unable to stabilize of a single lower extremity and has difficulty with steps as well as narrow base of support.  Patient will benefit from skilled physical therapy to increase strength, stability, and increase functional capacity of mobility for improved quality of life.  OBJECTIVE IMPAIRMENTS: Abnormal gait, decreased activity tolerance, decreased balance, decreased endurance, decreased knowledge of use of DME, decreased mobility, difficulty walking, decreased strength, improper body mechanics, postural dysfunction, and obesity.   ACTIVITY LIMITATIONS: carrying, bending, standing, squatting, stairs, transfers, reach over head, locomotion level, and caring for others  PARTICIPATION LIMITATIONS:  meal prep, cleaning, laundry, shopping, community activity, and yard work  PERSONAL FACTORS: Age, Behavior pattern, Fitness, Past/current experiences, Profession, Sex, Time since onset of injury/illness/exacerbation, and 3+ comorbidities: HTN, HLD, Type II DM, urinary frequency, history of lyme disease, dysphonia, Phlebitis.   are also affecting patient's functional outcome.   REHAB POTENTIAL: Fair    CLINICAL DECISION MAKING: Evolving/moderate complexity  EVALUATION COMPLEXITY: Moderate  PLAN:  PT FREQUENCY: 2x/week  PT DURATION: 12 weeks  PLANNED INTERVENTIONS: Therapeutic exercises, Therapeutic activity, Neuromuscular re-education, Balance training, Gait training, Patient/Family education, Self Care, Joint mobilization, Stair training, Vestibular training, Canalith repositioning, Visual/preceptual remediation/compensation, DME instructions, Dry Needling, Cognitive remediation, Wheelchair mobility training, Spinal manipulation, Spinal mobilization, Cryotherapy, Moist heat, Splintting, Taping, Vasopneumatic device, Traction, Biofeedback, Manual therapy, and Re-evaluation  PLAN FOR NEXT SESSION: give HEP, work on stability, endurance    [employer], PT 11/15/2022, 8:15 AM

## 2022-11-14 ENCOUNTER — Ambulatory Visit: Payer: Medicare Other | Attending: Family Medicine

## 2022-11-14 DIAGNOSIS — R262 Difficulty in walking, not elsewhere classified: Secondary | ICD-10-CM | POA: Insufficient documentation

## 2022-11-14 DIAGNOSIS — R2681 Unsteadiness on feet: Secondary | ICD-10-CM | POA: Insufficient documentation

## 2022-11-14 DIAGNOSIS — M6281 Muscle weakness (generalized): Secondary | ICD-10-CM | POA: Insufficient documentation

## 2022-11-15 ENCOUNTER — Ambulatory Visit: Payer: Medicare Other

## 2022-11-19 ENCOUNTER — Ambulatory Visit: Payer: Medicare Other

## 2022-11-20 ENCOUNTER — Ambulatory Visit: Payer: Medicare Other

## 2022-11-21 ENCOUNTER — Ambulatory Visit: Payer: Medicare Other

## 2022-11-22 ENCOUNTER — Ambulatory Visit: Payer: Medicare Other

## 2022-11-26 ENCOUNTER — Ambulatory Visit: Payer: Medicare Other | Attending: Family Medicine

## 2022-11-26 DIAGNOSIS — R2689 Other abnormalities of gait and mobility: Secondary | ICD-10-CM | POA: Insufficient documentation

## 2022-11-26 DIAGNOSIS — R262 Difficulty in walking, not elsewhere classified: Secondary | ICD-10-CM | POA: Insufficient documentation

## 2022-11-26 DIAGNOSIS — M6281 Muscle weakness (generalized): Secondary | ICD-10-CM | POA: Diagnosis present

## 2022-11-26 DIAGNOSIS — R2681 Unsteadiness on feet: Secondary | ICD-10-CM | POA: Diagnosis present

## 2022-11-26 NOTE — Therapy (Signed)
OUTPATIENT PHYSICAL THERAPY NEURO TREATMENT  Patient Name: Karen Frank MRN: [id number] DOB:19-Jul-1945, 77 y.o., female Today's Date: 11/26/2022   PCP: Wilford Corner PA REFERRING PROVIDER: Wilford Corner, PA  END OF SESSION:  PT End of Session - 11/26/22 1558     Visit Number 2    Number of Visits 24    Date for PT Re-Evaluation 02/07/23    Authorization Type 1/10 eval 11/14/22    PT Start Time 1600    PT Stop Time 1643    PT Time Calculation (min) 43 min              History reviewed. No pertinent past medical history. History reviewed. No pertinent surgical history. There are no problems to display for this patient.   ONSET DATE: February 2024  REFERRING DIAG: imbalance  THERAPY DIAG:  Unsteadiness on feet  Difficulty in walking, not elsewhere classified  Muscle weakness (generalized)  Other abnormalities of gait and mobility  Impairment of balance  Rationale for Evaluation and Treatment: Rehabilitation  SUBJECTIVE:                                                                                                                                                                                             SUBJECTIVE STATEMENT: Pt reports she doesn't feel confident in her walking at beginning of session. Missed last session due to car difficulties but called to cancel.  Pt accompanied by: self  PERTINENT HISTORY: Patient is returning back to PT for balance deficits. She has been seen in this clinic in the past, was discharged due to no shows. Patient had a fall in November 2023. Uses a cane for ambulation of short distance.   PAIN:  Are you having pain? No  PRECAUTIONS: Fall  WEIGHT BEARING RESTRICTIONS: No  FALLS: Has patient fallen in last 6 months? Yes. Number of falls 1  LIVING ENVIRONMENT: Lives with: lives alone Lives in: House/apartment Stairs: No Has following equipment at home: Single point cane, Walker - 2 wheeled, and  shower chair  PLOF: Independent with basic ADLs  PATIENT GOALS: walk again, be able to go for long walks again  OBJECTIVE:   DIAGNOSTIC FINDINGS:  IMPRESSION: No acute intracranial abnormality.   IMPRESSION: (06/22/21) 1. Ventriculomegaly, slightly progressed from 06/15/2014. This does not appear significantly disproportionate to the cerebral sulci. This however does not exclude the diagnosis of normal pressure hydrocephalus in the appropriate context. 2. Moderate findings of follow-up chronic small vessel disease as further detailed above. 3. Findings consistent with sequelae of remote microhemorrhage in the left posterior frontal lobe.   CT Lumbar spine without  contrast 05/07/2021: 1. No acute fracture or listhesis. There is chronic-appearing anterolisthesis of L4 on L5. 2. 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    COGNITION: Overall cognitive status: Within functional limits for tasks assessed   SENSATION: WFL  COORDINATION: Heel shin test: WFL Nose finger: decreased L; WFL    POSTURE: rounded shoulders and forward head    LOWER EXTREMITY MMT:    MMT Right Eval Left Eval  Hip flexion 4 4+  Hip extension    Hip abduction 3+ 3+  Hip adduction 3 4-  Hip internal rotation    Hip external rotation    Knee flexion 3+ 3+  Knee extension 4- 4  Ankle dorsiflexion 4- 4-  Ankle plantarflexion 4- 4-  Ankle inversion    Ankle eversion    (Blank rows = not tested)  BED MOBILITY:  Patient reports independent  TRANSFERS: Assistive device utilized:  hurrycane   Sit to stand: CGA Stand to sit: CGA Chair to chair: CGA   RAMP:  *address in future session  CURB:  *address in future session    GAIT: Gait pattern: decreased arm swing- Right, decreased arm swing- Left, decreased step length- Right, decreased step length- Left, decreased stride length, decreased hip/knee flexion- Right, decreased hip/knee flexion- Left, poor foot clearance- Right, and poor foot  clearance- Left Distance walked: ~50' w/ CGA and Hurry [employer] device utilized:  Hurry cane Level of assistance: CGA Comments: Pt held [employer] in [employer], PT educated pt on proper technique for increased safety, and advised pt to switch cane to L side.    FUNCTIONAL TESTS:  5 times sit to stand: 11.1 sec 6 minute walk test: 255' in 2\' 51"  w/ Hurry Cane in L hand and CGA 10 meter walk test: 0.64 w/ Hurry Cane in R hand and CGA (see above)   Berg Balance Scale: 32/56  PATIENT SURVEYS:  FOTO 44, predicted d/c 52  TODAY'S TREATMENT:                                                                                                                              DATE: 11/26/22  Unless otherwise stated, CGA was provided and gait belt donned in order to ensure pt safety. Hurry cane was used throughout for mobility.   TherEx: (23 min) -Ambulation x 50' at pt-selected pace for global warm-up at beginning of session  -6 min x whiteboard hangman dual task activity for increased standing endurance, pt needed VC for reminding decreasing UE support  -2 x 10 x 3 sec B re: seated marches w/ emphasis on eccentric control for improved hip flexor strength. Pt reported medium difficult L > R. VC for maintaining neural knee > L hip ER  TherAct:  Multiple STS throughout session, CGA.   NMRE: (16 min) - 3 x 4 foam roller step over, spaced 3' apart, for foot clearance. Seated rest breaks provided btwn trials with hurrycane -made the foam rollers  closer by 2', pt reported medium to difficult with step through  -1 x 10 B: lateral step over on Airex at board for improved weight shift and balance, cues to limit UE support & increase step length. Tactile cue of SPT's foot between pt's stance provided.   -Pt education about muscle growth re: why one side feels weaker, how long it takes on average to build muscle       PATIENT EDUCATION: Education details: goals, Pt educated on the purpose of PT, safety  considerations Person educated: Patient Education method: Explanation, Demonstration, Tactile cues, and Verbal cues Education comprehension: verbalized understanding, returned demonstration, verbal cues required, tactile cues required, and needs further education  HOME EXERCISE PROGRAM: Access Code: [email or web address] URL: [email or web address] Date: 11/26/2022 Prepared by: Precious Bard Exercises - Seated March  - 1 x daily - 7 x weekly - 2 sets - 10 reps - 5 hold   GOALS: Goals reviewed with patient? Yes  SHORT TERM GOALS: Target date: 12/13/2022   Patient will be independent in home exercise program to improve strength/mobility for better functional independence with ADLs. Baseline: Given 6/10 Goal status: INITIAL    LONG TERM GOALS: Target date: 02/07/2023  Patient will increase FOTO score to equal to or greater than 52 %   to demonstrate statistically significant improvement in mobility and quality of life.  Baseline: 5/29: 49% Goal status: IN PROGRESS  2.  Patient will increase Berg Balance score by > 6 points to demonstrate decreased fall risk during functional activities Baseline: 5/29: 32/56  Goal status: IN PROGRESS  3.  Patient will increase 10 meter walk test to >1.56m/s as to improve gait speed for better community ambulation and to reduce fall risk. Baseline: 5/29: 0.64 w/ Hurry Cane in R hand and CGA  Goal status: IN PROGRESS  4.  Patient will increase six minute walk test distance to >1000 for progression to community ambulator and improve gait ability Baseline: 255' in 2\' 51"  w/ Hurry Cane in L hand and CGA Goal status: IN PROGRESS    ASSESSMENT:  CLINICAL IMPRESSION: Patient presents to PT with marked increase in anxiety to therapist, stating she doubts her ability to be able to walk but is willing to try. During ambulation with step over activity, patient's step length significantly improved with heel strike and increased stance time B. During therex, pt  frequently stated she notices her RLE feels significantly weaker than her LLE; see above for pt education provided. Of note, pt's self-confidence increased throughout the session, and by end of session pt's affect improved greatly. Patient will benefit from skilled physical therapy to increase strength, stability, and increase functional capacity of mobility for improved quality of life.  OBJECTIVE IMPAIRMENTS: Abnormal gait, decreased activity tolerance, decreased balance, decreased endurance, decreased knowledge of use of DME, decreased mobility, difficulty walking, decreased strength, improper body mechanics, postural dysfunction, and obesity.   ACTIVITY LIMITATIONS: carrying, bending, standing, squatting, stairs, transfers, reach over head, locomotion level, and caring for others  PARTICIPATION LIMITATIONS: meal prep, cleaning, laundry, shopping, community activity, and yard work  PERSONAL FACTORS: Age, Behavior pattern, Fitness, Past/current experiences, Profession, Sex, Time since onset of injury/illness/exacerbation, and 3+ comorbidities: HTN, HLD, Type II DM, urinary frequency, history of lyme disease, dysphonia, Phlebitis.   are also affecting patient's functional outcome.   REHAB POTENTIAL: Fair    CLINICAL DECISION MAKING: Evolving/moderate complexity  EVALUATION COMPLEXITY: Moderate  PLAN:  PT FREQUENCY: 2x/week  PT DURATION: 12 weeks  PLANNED INTERVENTIONS: Therapeutic exercises, Therapeutic activity,  Neuromuscular re-education, Balance training, Gait training, Patient/Family education, Self Care, Joint mobilization, Stair training, Vestibular training, Canalith repositioning, Visual/preceptual remediation/compensation, DME instructions, Dry Needling, Cognitive remediation, Wheelchair mobility training, Spinal manipulation, Spinal mobilization, Cryotherapy, Moist heat, Splintting, Taping, Vasopneumatic device, Traction, Biofeedback, Manual therapy, and Re-evaluation  PLAN FOR  NEXT SESSION: progress dynamic stability, foot clearance & stride length, ramp navigation   [place] Anaiyah Anglemyer, SPT   This entire session was performed under direct supervision and direction of a licensed Estate agent . I have personally read, edited and approve of the note as written.  Precious Bard, PT 11/26/2022, 5:28 PM

## 2022-11-27 ENCOUNTER — Ambulatory Visit: Payer: Medicare Other

## 2022-11-28 ENCOUNTER — Ambulatory Visit: Payer: Medicare Other

## 2022-11-29 ENCOUNTER — Ambulatory Visit: Payer: Medicare Other

## 2022-12-04 ENCOUNTER — Ambulatory Visit: Payer: Medicare Other

## 2022-12-06 ENCOUNTER — Ambulatory Visit: Payer: Medicare Other

## 2022-12-10 ENCOUNTER — Ambulatory Visit: Payer: Medicare Other | Admitting: Physical Therapy

## 2022-12-11 ENCOUNTER — Ambulatory Visit: Payer: Medicare Other

## 2022-12-13 ENCOUNTER — Ambulatory Visit: Payer: Medicare Other

## 2022-12-18 ENCOUNTER — Ambulatory Visit: Payer: Medicare Other

## 2022-12-25 ENCOUNTER — Ambulatory Visit: Payer: Medicare Other

## 2022-12-27 ENCOUNTER — Ambulatory Visit: Payer: Medicare Other

## 2023-01-01 ENCOUNTER — Ambulatory Visit: Payer: Medicare Other

## 2023-01-02 ENCOUNTER — Ambulatory Visit: Payer: Medicare Other

## 2023-01-03 ENCOUNTER — Ambulatory Visit: Payer: Medicare Other

## 2023-01-07 ENCOUNTER — Ambulatory Visit: Payer: Medicare Other

## 2023-01-07 ENCOUNTER — Other Ambulatory Visit: Payer: Self-pay | Admitting: Family Medicine

## 2023-01-07 DIAGNOSIS — R32 Unspecified urinary incontinence: Secondary | ICD-10-CM

## 2023-01-07 DIAGNOSIS — M5136 Other intervertebral disc degeneration, lumbar region: Secondary | ICD-10-CM

## 2023-01-08 ENCOUNTER — Ambulatory Visit: Payer: Medicare Other

## 2023-01-09 ENCOUNTER — Ambulatory Visit: Payer: Medicare Other

## 2023-01-10 ENCOUNTER — Ambulatory Visit: Payer: Medicare Other

## 2023-01-14 ENCOUNTER — Ambulatory Visit: Payer: Medicare Other

## 2023-01-17 ENCOUNTER — Ambulatory Visit: Payer: Medicare Other

## 2023-01-17 NOTE — Therapy (Signed)
OUTPATIENT PHYSICAL THERAPY NEURO TREATMENT  Patient Name: Karen Frank MRN: [id number] DOB:1946-01-10, 77 y.o., female Today's Date: 01/21/2023   PCP: Wilford Corner PA REFERRING PROVIDER: Wilford Corner, PA  END OF SESSION:  PT End of Session - 01/21/23 1530     Visit Number 3    Number of Visits 24    Date for PT Re-Evaluation 02/07/23    Authorization Type 3/10 eval 11/14/22    PT Start Time 1530    PT Stop Time 1614    PT Time Calculation (min) 44 min    Equipment Utilized During Treatment Gait belt    Activity Tolerance Patient tolerated treatment well;Patient limited by fatigue    Behavior During Therapy [facility] for tasks assessed/performed               History reviewed. No pertinent past medical history. History reviewed. No pertinent surgical history. There are no problems to display for this patient.   ONSET DATE: February 2024  REFERRING DIAG: imbalance  THERAPY DIAG:  Unsteadiness on feet  Difficulty in walking, not elsewhere classified  Muscle weakness (generalized)  Other abnormalities of gait and mobility  Rationale for Evaluation and Treatment: Rehabilitation  SUBJECTIVE:                                                                                                                                                                                             SUBJECTIVE STATEMENT: Patient has not been seen in two months. Reports its because she hasn't had a car.   Pt accompanied by: self  PERTINENT HISTORY: Patient is returning back to PT for balance deficits. She has been seen in this clinic in the past, was discharged due to no shows. Patient had a fall in November 2023. Uses a cane for ambulation of short distance.   PAIN:  Are you having pain? No  PRECAUTIONS: Fall  WEIGHT BEARING RESTRICTIONS: No  FALLS: Has patient fallen in last 6 months? Yes. Number of falls 1  LIVING ENVIRONMENT: Lives with: lives alone Lives  in: House/apartment Stairs: No Has following equipment at home: Single point cane, Walker - 2 wheeled, and shower chair  PLOF: Independent with basic ADLs  PATIENT GOALS: walk again, be able to go for long walks again  OBJECTIVE:   DIAGNOSTIC FINDINGS:  IMPRESSION: No acute intracranial abnormality.   IMPRESSION: (06/22/21) 1. Ventriculomegaly, slightly progressed from 06/15/2014. This does not appear significantly disproportionate to the cerebral sulci. This however does not exclude the diagnosis of normal pressure hydrocephalus in the appropriate context. 2. Moderate findings of follow-up chronic small vessel disease as further detailed  above. 3. Findings consistent with sequelae of remote microhemorrhage in the left posterior frontal lobe.   CT Lumbar spine without contrast 05/07/2021: 1. No acute fracture or listhesis. There is chronic-appearing anterolisthesis of L4 on L5. 2. 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    COGNITION: Overall cognitive status: Within functional limits for tasks assessed   SENSATION: WFL  COORDINATION: Heel shin test: WFL Nose finger: decreased L; WFL    POSTURE: rounded shoulders and forward head    LOWER EXTREMITY MMT:    MMT Right Eval Left Eval  Hip flexion 4 4+  Hip extension    Hip abduction 3+ 3+  Hip adduction 3 4-  Hip internal rotation    Hip external rotation    Knee flexion 3+ 3+  Knee extension 4- 4  Ankle dorsiflexion 4- 4-  Ankle plantarflexion 4- 4-  Ankle inversion    Ankle eversion    (Blank rows = not tested)  BED MOBILITY:  Patient reports independent  TRANSFERS: Assistive device utilized:  hurrycane   Sit to stand: CGA Stand to sit: CGA Chair to chair: CGA   RAMP:  *address in future session  CURB:  *address in future session    GAIT: Gait pattern: decreased arm swing- Right, decreased arm swing- Left, decreased step length- Right, decreased step length- Left, decreased stride length,  decreased hip/knee flexion- Right, decreased hip/knee flexion- Left, poor foot clearance- Right, and poor foot clearance- Left Distance walked: ~50' w/ CGA and Hurry [employer] device utilized:  Hurry cane Level of assistance: CGA Comments: Pt held [employer] in [employer], PT educated pt on proper technique for increased safety, and advised pt to switch cane to L side.    FUNCTIONAL TESTS:  5 times sit to stand: 11.1 sec 6 minute walk test: 255' in 2\' 51"  w/ Hurry Cane in L hand and CGA 10 meter walk test: 0.64 w/ Hurry Cane in R hand and CGA (see above)   Berg Balance Scale: 32/56  PATIENT SURVEYS:  FOTO 44, predicted d/c 52  TODAY'S TREATMENT:                                                                                                                              DATE: 01/21/23  Unless otherwise stated, CGA was provided and gait belt donned in order to ensure pt safety. Hurry cane was used throughout for mobility.   TherEx: -Ambulation x 96' at pt-selected pace with hurrycane, close CGA; foot drag noted bilaterally Standing marches at bar 15x each LE    10x STS  Lateral step over hedgehog and back 10x each LE seated position  RTB hamstring curl 15x each LE  seated RTB march with df ; band around feet 10x  NMRE:  Standing with CGA next to support surface:  Airex pad: static stand 30 seconds x 2 trials, noticeable trembling of ankles/LE's with fatigue and challenge to maintain stability Airex pad: horizontal head  turns 30 seconds scanning room 10x ; cueing for arc of motion  Airex pad: vertical head turns 30 seconds, cueing for arc of motion, noticeable sway with upward gaze increasing demand on ankle righting reaction musculature Airex pad: one foot on 6" step one foot on airex pad, hold position for 30 seconds, switch legs, 2x each LE;  Ambulate periphery of gym with visual scan laterally and vertically for cones, reach for cones. Increased shuffle steppage noted with head  turns ; use of hurrycane; close CGA and cues for scanning ; x 2 trials      PATIENT EDUCATION: Education details: goals, Pt educated on the purpose of PT, safety considerations Person educated: Patient Education method: Explanation, Demonstration, Tactile cues, and Verbal cues Education comprehension: verbalized understanding, returned demonstration, verbal cues required, tactile cues required, and needs further education  HOME EXERCISE PROGRAM: Access Code: [email or web address] URL: [email or web address] Date: 11/26/2022 Prepared by: Precious Bard Exercises - Seated March  - 1 x daily - 7 x weekly - 2 sets - 10 reps - 5 hold   GOALS: Goals reviewed with patient? Yes  SHORT TERM GOALS: Target date: 12/13/2022   Patient will be independent in home exercise program to improve strength/mobility for better functional independence with ADLs. Baseline: Given 6/10 Goal status: INITIAL    LONG TERM GOALS: Target date: 02/07/2023  Patient will increase FOTO score to equal to or greater than 52 %   to demonstrate statistically significant improvement in mobility and quality of life.  Baseline: 5/29: 49% Goal status: IN PROGRESS  2.  Patient will increase Berg Balance score by > 6 points to demonstrate decreased fall risk during functional activities Baseline: 5/29: 32/56  Goal status: IN PROGRESS  3.  Patient will increase 10 meter walk test to >1.83m/s as to improve gait speed for better community ambulation and to reduce fall risk. Baseline: 5/29: 0.64 w/ Hurry Cane in R hand and CGA  Goal status: IN PROGRESS  4.  Patient will increase six minute walk test distance to >1000 for progression to community ambulator and improve gait ability Baseline: 255' in 2\' 51"  w/ Hurry Cane in L hand and CGA Goal status: IN PROGRESS    ASSESSMENT:  CLINICAL IMPRESSION: Patient educated on need to attend therapy for compliance. Patient tolerated unstable surfaces well with noticeable ankle  righting reactions. Visual scanning with ambulation is challenging with increased shuffle steppage noted.  Patient will benefit from skilled physical therapy to increase strength, stability, and increase functional capacity of mobility for improved quality of life.  OBJECTIVE IMPAIRMENTS: Abnormal gait, decreased activity tolerance, decreased balance, decreased endurance, decreased knowledge of use of DME, decreased mobility, difficulty walking, decreased strength, improper body mechanics, postural dysfunction, and obesity.   ACTIVITY LIMITATIONS: carrying, bending, standing, squatting, stairs, transfers, reach over head, locomotion level, and caring for others  PARTICIPATION LIMITATIONS: meal prep, cleaning, laundry, shopping, community activity, and yard work  PERSONAL FACTORS: Age, Behavior pattern, Fitness, Past/current experiences, Profession, Sex, Time since onset of injury/illness/exacerbation, and 3+ comorbidities: HTN, HLD, Type II DM, urinary frequency, history of lyme disease, dysphonia, Phlebitis.   are also affecting patient's functional outcome.   REHAB POTENTIAL: Fair    CLINICAL DECISION MAKING: Evolving/moderate complexity  EVALUATION COMPLEXITY: Moderate  PLAN:  PT FREQUENCY: 2x/week  PT DURATION: 12 weeks  PLANNED INTERVENTIONS: Therapeutic exercises, Therapeutic activity, Neuromuscular re-education, Balance training, Gait training, Patient/Family education, Self Care, Joint mobilization, Stair training, Vestibular training, Canalith repositioning, Visual/preceptual remediation/compensation, DME instructions, Dry Needling, Cognitive remediation,  Wheelchair mobility training, Spinal manipulation, Spinal mobilization, Cryotherapy, Moist heat, Splintting, Taping, Vasopneumatic device, Traction, Biofeedback, Manual therapy, and Re-evaluation  PLAN FOR NEXT SESSION: progress dynamic stability, foot clearance & stride length, ramp navigation     Precious Bard, PT 01/21/2023,  4:15 PM

## 2023-01-21 ENCOUNTER — Ambulatory Visit: Payer: Medicare Other | Attending: Family Medicine

## 2023-01-21 DIAGNOSIS — R2681 Unsteadiness on feet: Secondary | ICD-10-CM | POA: Diagnosis not present

## 2023-01-21 DIAGNOSIS — R262 Difficulty in walking, not elsewhere classified: Secondary | ICD-10-CM | POA: Diagnosis present

## 2023-01-21 DIAGNOSIS — R2689 Other abnormalities of gait and mobility: Secondary | ICD-10-CM | POA: Diagnosis present

## 2023-01-21 DIAGNOSIS — M6281 Muscle weakness (generalized): Secondary | ICD-10-CM | POA: Diagnosis present

## 2023-01-23 ENCOUNTER — Ambulatory Visit: Payer: Medicare Other

## 2023-01-29 ENCOUNTER — Ambulatory Visit: Payer: Medicare Other

## 2023-01-31 ENCOUNTER — Ambulatory Visit: Payer: Medicare Other

## 2023-01-31 DIAGNOSIS — R262 Difficulty in walking, not elsewhere classified: Secondary | ICD-10-CM

## 2023-01-31 DIAGNOSIS — R2681 Unsteadiness on feet: Secondary | ICD-10-CM

## 2023-01-31 DIAGNOSIS — M6281 Muscle weakness (generalized): Secondary | ICD-10-CM

## 2023-01-31 DIAGNOSIS — R2689 Other abnormalities of gait and mobility: Secondary | ICD-10-CM

## 2023-01-31 NOTE — Therapy (Signed)
OUTPATIENT PHYSICAL THERAPY NEURO TREATMENT  Patient Name: Karen Frank MRN: [id number] DOB:1945/07/02, 77 y.o., female Today's Date: 01/31/2023   PCP: Wilford Corner PA REFERRING PROVIDER: Wilford Corner, PA  END OF SESSION:  PT End of Session - 01/31/23 1442     Visit Number 4    Number of Visits 24    Date for PT Re-Evaluation 02/07/23    Authorization Type 4/10 eval 11/14/22    PT Start Time 1445    PT Stop Time 1529    PT Time Calculation (min) 44 min    Equipment Utilized During Treatment Gait belt    Activity Tolerance Patient tolerated treatment well;Patient limited by fatigue    Behavior During Therapy [facility] for tasks assessed/performed                History reviewed. No pertinent past medical history. History reviewed. No pertinent surgical history. There are no problems to display for this patient.   ONSET DATE: February 2024  REFERRING DIAG: imbalance  THERAPY DIAG:  Unsteadiness on feet  Difficulty in walking, not elsewhere classified  Muscle weakness (generalized)  Other abnormalities of gait and mobility  Rationale for Evaluation and Treatment: Rehabilitation  SUBJECTIVE:                                                                                                                                                                                             SUBJECTIVE STATEMENT: Patient reports she felt better for two days after her last session.   Pt accompanied by: self  PERTINENT HISTORY: Patient is returning back to PT for balance deficits. She has been seen in this clinic in the past, was discharged due to no shows. Patient had a fall in November 2023. Uses a cane for ambulation of short distance.   PAIN:  Are you having pain? No  PRECAUTIONS: Fall  WEIGHT BEARING RESTRICTIONS: No  FALLS: Has patient fallen in last 6 months? Yes. Number of falls 1  LIVING ENVIRONMENT: Lives with: lives alone Lives in:  House/apartment Stairs: No Has following equipment at home: Single point cane, Walker - 2 wheeled, and shower chair  PLOF: Independent with basic ADLs  PATIENT GOALS: walk again, be able to go for long walks again  OBJECTIVE:   DIAGNOSTIC FINDINGS:  IMPRESSION: No acute intracranial abnormality.   IMPRESSION: (06/22/21) 1. Ventriculomegaly, slightly progressed from 06/15/2014. This does not appear significantly disproportionate to the cerebral sulci. This however does not exclude the diagnosis of normal pressure hydrocephalus in the appropriate context. 2. Moderate findings of follow-up chronic small vessel disease as further detailed above. 3. Findings  consistent with sequelae of remote microhemorrhage in the left posterior frontal lobe.   CT Lumbar spine without contrast 05/07/2021: 1. No acute fracture or listhesis. There is chronic-appearing anterolisthesis of L4 on L5. 2. 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    COGNITION: Overall cognitive status: Within functional limits for tasks assessed   SENSATION: WFL  COORDINATION: Heel shin test: WFL Nose finger: decreased L; WFL    POSTURE: rounded shoulders and forward head    LOWER EXTREMITY MMT:    MMT Right Eval Left Eval  Hip flexion 4 4+  Hip extension    Hip abduction 3+ 3+  Hip adduction 3 4-  Hip internal rotation    Hip external rotation    Knee flexion 3+ 3+  Knee extension 4- 4  Ankle dorsiflexion 4- 4-  Ankle plantarflexion 4- 4-  Ankle inversion    Ankle eversion    (Blank rows = not tested)  BED MOBILITY:  Patient reports independent  TRANSFERS: Assistive device utilized:  hurrycane   Sit to stand: CGA Stand to sit: CGA Chair to chair: CGA   RAMP:  *address in future session  CURB:  *address in future session    GAIT: Gait pattern: decreased arm swing- Right, decreased arm swing- Left, decreased step length- Right, decreased step length- Left, decreased stride length,  decreased hip/knee flexion- Right, decreased hip/knee flexion- Left, poor foot clearance- Right, and poor foot clearance- Left Distance walked: ~50' w/ CGA and Hurry [employer] device utilized:  Hurry cane Level of assistance: CGA Comments: Pt held [employer] in [employer], PT educated pt on proper technique for increased safety, and advised pt to switch cane to L side.    FUNCTIONAL TESTS:  5 times sit to stand: 11.1 sec 6 minute walk test: 255' in 2\' 51"  w/ Hurry Cane in L hand and CGA 10 meter walk test: 0.64 w/ Hurry Cane in R hand and CGA (see above)   Berg Balance Scale: 32/56  PATIENT SURVEYS:  FOTO 44, predicted d/c 52  TODAY'S TREATMENT:                                                                                                                              DATE: 01/31/23  Unless otherwise stated, CGA was provided and gait belt donned in order to ensure pt safety. Hurry cane was used throughout for mobility.   TherEx: -Ambulation x 96' at pt-selected pace with hurrycane, close CGA; foot drag noted bilaterally Ambulation with RW 150 ft, excellent gait speed and foot clearance; recommend requesting MD for RW.   10x STS Seated: GTB march 12x each LE GTB abduction 15x  Lateral step over hedgehog and back 10x each LE seated position  RTB hamstring curl 15x each LE  seated Half foam roller df/pf 20x   NMRE:  Standing with CGA next to support surface:  Airex pad: static stand 30 seconds x 2 trials, noticeable trembling of ankles/LE's  with fatigue and challenge to maintain stability Airex pad: horizontal head turns 30 seconds scanning room 10x ; cueing for arc of motion  Airex pad: vertical head turns 30 seconds, cueing for arc of motion, noticeable sway with upward gaze increasing demand on ankle righting reaction musculature Airex pad: one foot on 6" step one foot on airex pad, hold position for 30 seconds, switch legs, 2x each LE; easier for RLE as support leg Airex pad  4" step toe taps 10x  Airex pad: dual task with sorting letters by color for visual scan and reach x 6 minutes       PATIENT EDUCATION: Education details: goals, Pt educated on the purpose of PT, safety considerations Person educated: Patient Education method: Explanation, Demonstration, Tactile cues, and Verbal cues Education comprehension: verbalized understanding, returned demonstration, verbal cues required, tactile cues required, and needs further education  HOME EXERCISE PROGRAM: Access Code: [email or web address] URL: [email or web address] Date: 11/26/2022 Prepared by: Precious Bard Exercises - Seated March  - 1 x daily - 7 x weekly - 2 sets - 10 reps - 5 hold   GOALS: Goals reviewed with patient? Yes  SHORT TERM GOALS: Target date: 12/13/2022   Patient will be independent in home exercise program to improve strength/mobility for better functional independence with ADLs. Baseline: Given 6/10 Goal status: INITIAL    LONG TERM GOALS: Target date: 02/07/2023  Patient will increase FOTO score to equal to or greater than 52 %   to demonstrate statistically significant improvement in mobility and quality of life.  Baseline: 5/29: 49% Goal status: IN PROGRESS  2.  Patient will increase Berg Balance score by > 6 points to demonstrate decreased fall risk during functional activities Baseline: 5/29: 32/56  Goal status: IN PROGRESS  3.  Patient will increase 10 meter walk test to >1.70m/s as to improve gait speed for better community ambulation and to reduce fall risk. Baseline: 5/29: 0.64 w/ Hurry Cane in R hand and CGA  Goal status: IN PROGRESS  4.  Patient will increase six minute walk test distance to >1000 for progression to community ambulator and improve gait ability Baseline: 255' in 2\' 51"  w/ Hurry Cane in L hand and CGA Goal status: IN PROGRESS    ASSESSMENT:  CLINICAL IMPRESSION: Patient has significant gait improvements with use of RW, will send referral request  to referring physician for RW. Patient reports it is easer to perform tandem stance with RLE as support leg.Patient is eager to progress functional strength and mobility.  Patient will benefit from skilled physical therapy to increase strength, stability, and increase functional capacity of mobility for improved quality of life.  OBJECTIVE IMPAIRMENTS: Abnormal gait, decreased activity tolerance, decreased balance, decreased endurance, decreased knowledge of use of DME, decreased mobility, difficulty walking, decreased strength, improper body mechanics, postural dysfunction, and obesity.   ACTIVITY LIMITATIONS: carrying, bending, standing, squatting, stairs, transfers, reach over head, locomotion level, and caring for others  PARTICIPATION LIMITATIONS: meal prep, cleaning, laundry, shopping, community activity, and yard work  PERSONAL FACTORS: Age, Behavior pattern, Fitness, Past/current experiences, Profession, Sex, Time since onset of injury/illness/exacerbation, and 3+ comorbidities: HTN, HLD, Type II DM, urinary frequency, history of lyme disease, dysphonia, Phlebitis.   are also affecting patient's functional outcome.   REHAB POTENTIAL: Fair    CLINICAL DECISION MAKING: Evolving/moderate complexity  EVALUATION COMPLEXITY: Moderate  PLAN:  PT FREQUENCY: 2x/week  PT DURATION: 12 weeks  PLANNED INTERVENTIONS: Therapeutic exercises, Therapeutic activity, Neuromuscular re-education, Balance training, Gait training, Patient/Family education, Self Care,  Joint mobilization, Stair training, Vestibular training, Canalith repositioning, Visual/preceptual remediation/compensation, DME instructions, Dry Needling, Cognitive remediation, Wheelchair mobility training, Spinal manipulation, Spinal mobilization, Cryotherapy, Moist heat, Splintting, Taping, Vasopneumatic device, Traction, Biofeedback, Manual therapy, and Re-evaluation  PLAN FOR NEXT SESSION: progress dynamic stability, foot clearance &  stride length, ramp navigation     Precious Bard, PT 01/31/2023, 3:36 PM

## 2023-02-04 ENCOUNTER — Ambulatory Visit: Payer: Medicare Other

## 2023-02-05 NOTE — Therapy (Signed)
OUTPATIENT PHYSICAL THERAPY NEURO TREATMENT/RECERT  Patient Name: Karen Frank MRN: [id number] DOB:Nov 11, 1945, 77 y.o., female Today's Date: 02/06/2023   PCP: Wilford Corner PA REFERRING PROVIDER: Wilford Corner, PA  END OF SESSION:  PT End of Session - 02/06/23 1446     Visit Number 5    Number of Visits 29    Date for PT Re-Evaluation 05/01/23    Authorization Type 4/10 eval 11/14/22    PT Start Time 1445    PT Stop Time 1529    PT Time Calculation (min) 44 min    Equipment Utilized During Treatment Gait belt    Activity Tolerance Patient tolerated treatment well;Patient limited by fatigue    Behavior During Therapy [facility] for tasks assessed/performed                 History reviewed. No pertinent past medical history. History reviewed. No pertinent surgical history. There are no problems to display for this patient.   ONSET DATE: February 2024  REFERRING DIAG: imbalance  THERAPY DIAG:  Unsteadiness on feet  Difficulty in walking, not elsewhere classified  Muscle weakness (generalized)  Other abnormalities of gait and mobility  Rationale for Evaluation and Treatment: Rehabilitation  SUBJECTIVE:                                                                                                                                                                                             SUBJECTIVE STATEMENT: Patient waiting back for order from doctor.   Pt accompanied by: self  PERTINENT HISTORY: Patient is returning back to PT for balance deficits. She has been seen in this clinic in the past, was discharged due to no shows. Patient had a fall in November 2023. Uses a cane for ambulation of short distance.   PAIN:  Are you having pain? No  PRECAUTIONS: Fall  WEIGHT BEARING RESTRICTIONS: No  FALLS: Has patient fallen in last 6 months? Yes. Number of falls 1  LIVING ENVIRONMENT: Lives with: lives alone Lives in: House/apartment Stairs:  No Has following equipment at home: Single point cane, Walker - 2 wheeled, and shower chair  PLOF: Independent with basic ADLs  PATIENT GOALS: walk again, be able to go for long walks again  OBJECTIVE:   DIAGNOSTIC FINDINGS:  IMPRESSION: No acute intracranial abnormality.   IMPRESSION: (06/22/21) 1. Ventriculomegaly, slightly progressed from 06/15/2014. This does not appear significantly disproportionate to the cerebral sulci. This however does not exclude the diagnosis of normal pressure hydrocephalus in the appropriate context. 2. Moderate findings of follow-up chronic small vessel disease as further detailed above. 3. Findings consistent with sequelae of  remote microhemorrhage in the left posterior frontal lobe.   CT Lumbar spine without contrast 05/07/2021: 1. No acute fracture or listhesis. There is chronic-appearing anterolisthesis of L4 on L5. 2. 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    COGNITION: Overall cognitive status: Within functional limits for tasks assessed   SENSATION: WFL  COORDINATION: Heel shin test: WFL Nose finger: decreased L; WFL    POSTURE: rounded shoulders and forward head    LOWER EXTREMITY MMT:    MMT Right Eval Left Eval  Hip flexion 4 4+  Hip extension    Hip abduction 3+ 3+  Hip adduction 3 4-  Hip internal rotation    Hip external rotation    Knee flexion 3+ 3+  Knee extension 4- 4  Ankle dorsiflexion 4- 4-  Ankle plantarflexion 4- 4-  Ankle inversion    Ankle eversion    (Blank rows = not tested)  BED MOBILITY:  Patient reports independent  TRANSFERS: Assistive device utilized:  hurrycane   Sit to stand: CGA Stand to sit: CGA Chair to chair: CGA   RAMP:  *address in future session  CURB:  *address in future session    GAIT: Gait pattern: decreased arm swing- Right, decreased arm swing- Left, decreased step length- Right, decreased step length- Left, decreased stride length, decreased hip/knee flexion-  Right, decreased hip/knee flexion- Left, poor foot clearance- Right, and poor foot clearance- Left Distance walked: ~50' w/ CGA and Hurry [employer] device utilized:  Hurry cane Level of assistance: CGA Comments: Pt held [employer] in [employer], PT educated pt on proper technique for increased safety, and advised pt to switch cane to L side.    FUNCTIONAL TESTS:  5 times sit to stand: 11.1 sec 6 minute walk test: 255' in 2\' 51"  w/ Hurry Cane in L hand and CGA 10 meter walk test: 0.64 w/ Hurry Cane in R hand and CGA (see above)   Berg Balance Scale: 32/56  PATIENT SURVEYS:  FOTO 44, predicted d/c 52  TODAY'S TREATMENT:                                                                                                                              DATE: 02/06/23  Unless otherwise stated, CGA was provided and gait belt donned in order to ensure pt safety.    Goals performed: see below for details  TherEx:   10x STS Seated: 2lb ankle weights:  -marches 12x each LE -LAQ 12x each LE -heel raise 15x  GTB abduction 15x   Neuro RE-ed:  Airex pad: Static stand 30 seconds     PATIENT EDUCATION: Education details: goals, Pt educated on the purpose of PT, safety considerations Person educated: Patient Education method: Explanation, Demonstration, Tactile cues, and Verbal cues Education comprehension: verbalized understanding, returned demonstration, verbal cues required, tactile cues required, and needs further education  HOME EXERCISE PROGRAM: Access Code: [email or web address] URL: [email or web address] Date: 11/26/2022 Prepared by: Precious Bard Exercises -  Seated March  - 1 x daily - 7 x weekly - 2 sets - 10 reps - 5 hold   GOALS: Goals reviewed with patient? Yes  SHORT TERM GOALS: Target date:03/06/2023     Patient will be independent in home exercise program to improve strength/mobility for better functional independence with ADLs. Baseline: Given 6/10 8/21: intermittent  compliance Goal status: Partially Met    LONG TERM GOALS: Target date: 05/01/2023    Patient will increase FOTO score to equal to or greater than 52 %   to demonstrate statistically significant improvement in mobility and quality of life.  Baseline: 5/29: 49% 8/21: 42% Goal status: IN PROGRESS  2.  Patient will increase Berg Balance score by > 6 points to demonstrate decreased fall risk during functional activities Baseline: 5/29: 32/56 8/21: 34/56  Goal status: IN PROGRESS  3.  Patient will increase 10 meter walk test to >1.82m/s as to improve gait speed for better community ambulation and to reduce fall risk. Baseline: 5/29: 0.64 w/ Hurry Cane in R hand and CGA 8/21: 0.67 m/s with walker  Goal status: IN PROGRESS  4.  Patient will increase six minute walk test distance to >1000 for progression to community ambulator and improve gait ability Baseline: 255' in 2\' 51"  w/ Hurry Cane in L hand and CGA 8/21: 605 ft with RW Goal status: IN PROGRESS    ASSESSMENT:  CLINICAL IMPRESSION:  Patient missed about a month and a half due to personal reasons so goals are not met at this time. Patient aware of need to attend physical therapy in order to progress functional mobility. Is agreeable for her trial period of compliance during this next cert. She has improved stability and mobility with a RW and will benefit from order.  Patient will benefit from skilled physical therapy to increase strength, stability, and increase functional capacity of mobility for improved quality of life.  OBJECTIVE IMPAIRMENTS: Abnormal gait, decreased activity tolerance, decreased balance, decreased endurance, decreased knowledge of use of DME, decreased mobility, difficulty walking, decreased strength, improper body mechanics, postural dysfunction, and obesity.   ACTIVITY LIMITATIONS: carrying, bending, standing, squatting, stairs, transfers, reach over head, locomotion level, and caring for others  PARTICIPATION  LIMITATIONS: meal prep, cleaning, laundry, shopping, community activity, and yard work  PERSONAL FACTORS: Age, Behavior pattern, Fitness, Past/current experiences, Profession, Sex, Time since onset of injury/illness/exacerbation, and 3+ comorbidities: HTN, HLD, Type II DM, urinary frequency, history of lyme disease, dysphonia, Phlebitis.   are also affecting patient's functional outcome.   REHAB POTENTIAL: Fair    CLINICAL DECISION MAKING: Evolving/moderate complexity  EVALUATION COMPLEXITY: Moderate  PLAN:  PT FREQUENCY: 2x/week  PT DURATION: 12 weeks  PLANNED INTERVENTIONS: Therapeutic exercises, Therapeutic activity, Neuromuscular re-education, Balance training, Gait training, Patient/Family education, Self Care, Joint mobilization, Stair training, Vestibular training, Canalith repositioning, Visual/preceptual remediation/compensation, DME instructions, Dry Needling, Cognitive remediation, Wheelchair mobility training, Spinal manipulation, Spinal mobilization, Cryotherapy, Moist heat, Splintting, Taping, Vasopneumatic device, Traction, Biofeedback, Manual therapy, and Re-evaluation  PLAN FOR NEXT SESSION: progress dynamic stability, foot clearance & stride length, ramp navigation     Precious Bard, PT 02/06/2023, 3:28 PM

## 2023-02-06 ENCOUNTER — Ambulatory Visit: Payer: Medicare Other

## 2023-02-06 DIAGNOSIS — R2681 Unsteadiness on feet: Secondary | ICD-10-CM | POA: Diagnosis not present

## 2023-02-06 DIAGNOSIS — R2689 Other abnormalities of gait and mobility: Secondary | ICD-10-CM

## 2023-02-06 DIAGNOSIS — R262 Difficulty in walking, not elsewhere classified: Secondary | ICD-10-CM

## 2023-02-06 DIAGNOSIS — M6281 Muscle weakness (generalized): Secondary | ICD-10-CM

## 2023-02-11 ENCOUNTER — Ambulatory Visit: Payer: Medicare Other

## 2023-02-13 ENCOUNTER — Ambulatory Visit: Payer: Medicare Other

## 2023-02-13 DIAGNOSIS — R262 Difficulty in walking, not elsewhere classified: Secondary | ICD-10-CM

## 2023-02-13 DIAGNOSIS — R2689 Other abnormalities of gait and mobility: Secondary | ICD-10-CM

## 2023-02-13 DIAGNOSIS — M6281 Muscle weakness (generalized): Secondary | ICD-10-CM

## 2023-02-13 DIAGNOSIS — R2681 Unsteadiness on feet: Secondary | ICD-10-CM

## 2023-02-13 NOTE — Therapy (Signed)
OUTPATIENT PHYSICAL THERAPY NEURO TREATMENT  Patient Name: Karen Frank MRN: [id number] DOB:05-Mar-1946, 77 y.o., female Today's Date: 02/13/2023   PCP: Wilford Corner PA REFERRING PROVIDER: Wilford Corner, PA  END OF SESSION:  PT End of Session - 02/13/23 1444     Visit Number 6    Number of Visits 29    Date for PT Re-Evaluation 05/01/23    Authorization Type 4/10 eval 11/14/22    PT Start Time 1445    PT Stop Time 1529    PT Time Calculation (min) 44 min    Equipment Utilized During Treatment Gait belt    Activity Tolerance Patient tolerated treatment well;Patient limited by fatigue    Behavior During Therapy [facility] for tasks assessed/performed                  History reviewed. No pertinent past medical history. History reviewed. No pertinent surgical history. There are no problems to display for this patient.   ONSET DATE: February 2024  REFERRING DIAG: imbalance  THERAPY DIAG:  Unsteadiness on feet  Difficulty in walking, not elsewhere classified  Muscle weakness (generalized)  Other abnormalities of gait and mobility  Rationale for Evaluation and Treatment: Rehabilitation  SUBJECTIVE:                                                                                                                                                                                             SUBJECTIVE STATEMENT: Patient reports her walking has been difficult. Reports she is not "walking as well". Reports she has started walking up 4-5 houses and back.   Pt accompanied by: self  PERTINENT HISTORY: Patient is returning back to PT for balance deficits. She has been seen in this clinic in the past, was discharged due to no shows. Patient had a fall in November 2023. Uses a cane for ambulation of short distance.   PAIN:  Are you having pain? No  PRECAUTIONS: Fall  WEIGHT BEARING RESTRICTIONS: No  FALLS: Has patient fallen in last 6 months? Yes. Number  of falls 1  LIVING ENVIRONMENT: Lives with: lives alone Lives in: House/apartment Stairs: No Has following equipment at home: Single point cane, Walker - 2 wheeled, and shower chair  PLOF: Independent with basic ADLs  PATIENT GOALS: walk again, be able to go for long walks again  OBJECTIVE:   DIAGNOSTIC FINDINGS:  IMPRESSION: No acute intracranial abnormality.   IMPRESSION: (06/22/21) 1. Ventriculomegaly, slightly progressed from 06/15/2014. This does not appear significantly disproportionate to the cerebral sulci. This however does not exclude the diagnosis of normal pressure hydrocephalus in the appropriate context. 2.  Moderate findings of follow-up chronic small vessel disease as further detailed above. 3. Findings consistent with sequelae of remote microhemorrhage in the left posterior frontal lobe.   CT Lumbar spine without contrast 05/07/2021: 1. No acute fracture or listhesis. There is chronic-appearing anterolisthesis of L4 on L5. 2. 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    COGNITION: Overall cognitive status: Within functional limits for tasks assessed   SENSATION: WFL  COORDINATION: Heel shin test: WFL Nose finger: decreased L; WFL    POSTURE: rounded shoulders and forward head    LOWER EXTREMITY MMT:    MMT Right Eval Left Eval  Hip flexion 4 4+  Hip extension    Hip abduction 3+ 3+  Hip adduction 3 4-  Hip internal rotation    Hip external rotation    Knee flexion 3+ 3+  Knee extension 4- 4  Ankle dorsiflexion 4- 4-  Ankle plantarflexion 4- 4-  Ankle inversion    Ankle eversion    (Blank rows = not tested)  BED MOBILITY:  Patient reports independent  TRANSFERS: Assistive device utilized:  hurrycane   Sit to stand: CGA Stand to sit: CGA Chair to chair: CGA   RAMP:  *address in future session  CURB:  *address in future session    GAIT: Gait pattern: decreased arm swing- Right, decreased arm swing- Left, decreased step length-  Right, decreased step length- Left, decreased stride length, decreased hip/knee flexion- Right, decreased hip/knee flexion- Left, poor foot clearance- Right, and poor foot clearance- Left Distance walked: ~50' w/ CGA and Hurry [employer] device utilized:  Hurry cane Level of assistance: CGA Comments: Pt held [employer] in [employer], PT educated pt on proper technique for increased safety, and advised pt to switch cane to L side.    FUNCTIONAL TESTS:  5 times sit to stand: 11.1 sec 6 minute walk test: 255' in 2\' 51"  w/ Hurry Cane in L hand and CGA 10 meter walk test: 0.64 w/ Hurry Cane in R hand and CGA (see above)   Berg Balance Scale: 32/56  PATIENT SURVEYS:  FOTO 44, predicted d/c 52  TODAY'S TREATMENT:                                                                                                                              DATE: 02/13/23  Unless otherwise stated, CGA was provided and gait belt donned in order to ensure pt safety.    TherEx: 10x STS Seated: 2lb ankle weights:  -marches 12x each LE -LAQ 12x each LE -heel raise 20x  -ER/IR with adduction ball squeeze 15x   LAQ with adduction ball squeeze 15x  Neuro RE-ed:   Airex pad 6" step tandem stance 30 second holds x 2 trials    ambulate in hallway: with RW:  -horizontal head turns with cues for reading alphabet from cards 86 ftx 2 sets -horizontal head turns with cues for reading number and symbols from cards 86  ft x 2 sets  -"red light/green light" for sudden initiation/termination of ambulation with close CGA for carryover to natural environment 2x 86 ft     PATIENT EDUCATION: Education details: goals, Pt educated on the purpose of PT, safety considerations Person educated: Patient Education method: Explanation, Demonstration, Tactile cues, and Verbal cues Education comprehension: verbalized understanding, returned demonstration, verbal cues required, tactile cues required, and needs further  education  HOME EXERCISE PROGRAM: Access Code: [email or web address] URL: [email or web address] Date: 11/26/2022 Prepared by: Precious Bard Exercises - Seated March  - 1 x daily - 7 x weekly - 2 sets - 10 reps - 5 hold   GOALS: Goals reviewed with patient? Yes  SHORT TERM GOALS: Target date:03/06/2023     Patient will be independent in home exercise program to improve strength/mobility for better functional independence with ADLs. Baseline: Given 6/10 8/21: intermittent compliance Goal status: Partially Met    LONG TERM GOALS: Target date: 05/01/2023    Patient will increase FOTO score to equal to or greater than 52 %   to demonstrate statistically significant improvement in mobility and quality of life.  Baseline: 5/29: 49% 8/21: 42% Goal status: IN PROGRESS  2.  Patient will increase Berg Balance score by > 6 points to demonstrate decreased fall risk during functional activities Baseline: 5/29: 32/56 8/21: 34/56  Goal status: IN PROGRESS  3.  Patient will increase 10 meter walk test to >1.3m/s as to improve gait speed for better community ambulation and to reduce fall risk. Baseline: 5/29: 0.64 w/ Hurry Cane in R hand and CGA 8/21: 0.67 m/s with walker  Goal status: IN PROGRESS  4.  Patient will increase six minute walk test distance to >1000 for progression to community ambulator and improve gait ability Baseline: 255' in 2\' 51"  w/ Hurry Cane in L hand and CGA 8/21: 605 ft with RW Goal status: IN PROGRESS    ASSESSMENT:  CLINICAL IMPRESSION: Patient is progressing with functional mobility training with walker. She tolerates progressive ambulation with use of walker and additional tasks. She is highly motivated throughout session and does require intermittent rest breaks due to fatigue. Patient will benefit from skilled physical therapy to increase strength, stability, and increase functional capacity of mobility for improved quality of life.  OBJECTIVE IMPAIRMENTS:  Abnormal gait, decreased activity tolerance, decreased balance, decreased endurance, decreased knowledge of use of DME, decreased mobility, difficulty walking, decreased strength, improper body mechanics, postural dysfunction, and obesity.   ACTIVITY LIMITATIONS: carrying, bending, standing, squatting, stairs, transfers, reach over head, locomotion level, and caring for others  PARTICIPATION LIMITATIONS: meal prep, cleaning, laundry, shopping, community activity, and yard work  PERSONAL FACTORS: Age, Behavior pattern, Fitness, Past/current experiences, Profession, Sex, Time since onset of injury/illness/exacerbation, and 3+ comorbidities: HTN, HLD, Type II DM, urinary frequency, history of lyme disease, dysphonia, Phlebitis.   are also affecting patient's functional outcome.   REHAB POTENTIAL: Fair    CLINICAL DECISION MAKING: Evolving/moderate complexity  EVALUATION COMPLEXITY: Moderate  PLAN:  PT FREQUENCY: 2x/week  PT DURATION: 12 weeks  PLANNED INTERVENTIONS: Therapeutic exercises, Therapeutic activity, Neuromuscular re-education, Balance training, Gait training, Patient/Family education, Self Care, Joint mobilization, Stair training, Vestibular training, Canalith repositioning, Visual/preceptual remediation/compensation, DME instructions, Dry Needling, Cognitive remediation, Wheelchair mobility training, Spinal manipulation, Spinal mobilization, Cryotherapy, Moist heat, Splintting, Taping, Vasopneumatic device, Traction, Biofeedback, Manual therapy, and Re-evaluation  PLAN FOR NEXT SESSION: progress dynamic stability, foot clearance & stride length, ramp navigation     Precious Bard, PT 02/13/2023, 3:29 PM

## 2023-02-19 ENCOUNTER — Ambulatory Visit: Payer: Medicare Other

## 2023-02-21 ENCOUNTER — Ambulatory Visit: Payer: Medicare Other

## 2023-02-25 ENCOUNTER — Ambulatory Visit: Payer: Medicare Other

## 2023-02-27 ENCOUNTER — Ambulatory Visit: Payer: Medicare Other

## 2023-03-04 ENCOUNTER — Ambulatory Visit: Payer: Medicare Other | Attending: Family Medicine

## 2023-03-04 DIAGNOSIS — R2689 Other abnormalities of gait and mobility: Secondary | ICD-10-CM | POA: Insufficient documentation

## 2023-03-04 DIAGNOSIS — M6281 Muscle weakness (generalized): Secondary | ICD-10-CM | POA: Diagnosis present

## 2023-03-04 DIAGNOSIS — R2681 Unsteadiness on feet: Secondary | ICD-10-CM | POA: Insufficient documentation

## 2023-03-04 DIAGNOSIS — R262 Difficulty in walking, not elsewhere classified: Secondary | ICD-10-CM | POA: Insufficient documentation

## 2023-03-04 NOTE — Therapy (Signed)
OUTPATIENT PHYSICAL THERAPY NEURO TREATMENT  Patient Name: Karen Frank MRN: [id number] DOB:1946/03/01, 77 y.o., female Today's Date: 03/04/2023   PCP: Wilford Corner PA REFERRING PROVIDER: Wilford Corner, PA  END OF SESSION:  PT End of Session - 03/04/23 1532     Visit Number 7    Number of Visits 29    Date for PT Re-Evaluation 05/01/23    Authorization Type 4/10 eval 11/14/22    PT Start Time 1533    PT Stop Time 1614    PT Time Calculation (min) 41 min    Equipment Utilized During Treatment Gait belt    Activity Tolerance Patient tolerated treatment well;Patient limited by fatigue    Behavior During Therapy [facility] for tasks assessed/performed                   History reviewed. No pertinent past medical history. History reviewed. No pertinent surgical history. There are no problems to display for this patient.   ONSET DATE: February 2024  REFERRING DIAG: imbalance  THERAPY DIAG:  Unsteadiness on feet  Difficulty in walking, not elsewhere classified  Muscle weakness (generalized)  Other abnormalities of gait and mobility  Rationale for Evaluation and Treatment: Rehabilitation  SUBJECTIVE:                                                                                                                                                                                             SUBJECTIVE STATEMENT: Patient reports it was difficult coming in today due to the weather.   Pt accompanied by: self  PERTINENT HISTORY: Patient is returning back to PT for balance deficits. She has been seen in this clinic in the past, was discharged due to no shows. Patient had a fall in November 2023. Uses a cane for ambulation of short distance.   PAIN:  Are you having pain? No  PRECAUTIONS: Fall  WEIGHT BEARING RESTRICTIONS: No  FALLS: Has patient fallen in last 6 months? Yes. Number of falls 1  LIVING ENVIRONMENT: Lives with: lives alone Lives in:  House/apartment Stairs: No Has following equipment at home: Single point cane, Walker - 2 wheeled, and shower chair  PLOF: Independent with basic ADLs  PATIENT GOALS: walk again, be able to go for long walks again  OBJECTIVE:   DIAGNOSTIC FINDINGS:  IMPRESSION: No acute intracranial abnormality.   IMPRESSION: (06/22/21) 1. Ventriculomegaly, slightly progressed from 06/15/2014. This does not appear significantly disproportionate to the cerebral sulci. This however does not exclude the diagnosis of normal pressure hydrocephalus in the appropriate context. 2. Moderate findings of follow-up chronic small vessel disease as further detailed  above. 3. Findings consistent with sequelae of remote microhemorrhage in the left posterior frontal lobe.   CT Lumbar spine without contrast 05/07/2021: 1. No acute fracture or listhesis. There is chronic-appearing anterolisthesis of L4 on L5. 2. 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    COGNITION: Overall cognitive status: Within functional limits for tasks assessed   SENSATION: WFL  COORDINATION: Heel shin test: WFL Nose finger: decreased L; WFL    POSTURE: rounded shoulders and forward head    LOWER EXTREMITY MMT:    MMT Right Eval Left Eval  Hip flexion 4 4+  Hip extension    Hip abduction 3+ 3+  Hip adduction 3 4-  Hip internal rotation    Hip external rotation    Knee flexion 3+ 3+  Knee extension 4- 4  Ankle dorsiflexion 4- 4-  Ankle plantarflexion 4- 4-  Ankle inversion    Ankle eversion    (Blank rows = not tested)  BED MOBILITY:  Patient reports independent  TRANSFERS: Assistive device utilized:  hurrycane   Sit to stand: CGA Stand to sit: CGA Chair to chair: CGA   RAMP:  *address in future session  CURB:  *address in future session    GAIT: Gait pattern: decreased arm swing- Right, decreased arm swing- Left, decreased step length- Right, decreased step length- Left, decreased stride length,  decreased hip/knee flexion- Right, decreased hip/knee flexion- Left, poor foot clearance- Right, and poor foot clearance- Left Distance walked: ~50' w/ CGA and Hurry [employer] device utilized:  Hurry cane Level of assistance: CGA Comments: Pt held [employer] in [employer], PT educated pt on proper technique for increased safety, and advised pt to switch cane to L side.    FUNCTIONAL TESTS:  5 times sit to stand: 11.1 sec 6 minute walk test: 255' in 2\' 51"  w/ Hurry Cane in L hand and CGA 10 meter walk test: 0.64 w/ Hurry Cane in R hand and CGA (see above)   Berg Balance Scale: 32/56  PATIENT SURVEYS:  FOTO 44, predicted d/c 52  TODAY'S TREATMENT:                                                                                                                              DATE: 03/04/23  Unless otherwise stated, CGA was provided and gait belt donned in order to ensure pt safety.    TherEx: Ambulate 148 ft with 2lb ankle weights; x 2 trials with RW and CGA 10x STS  Seated: 2.5lb ankle weights:  -marches 12x each LE -LAQ 12x each LE -heel raise 20x  -ER/IR with adduction ball squeeze 15x   LAQ with adduction ball squeeze 15x  Neuro RE-ed:  Standing with CGA next to support surface:  Airex pad: static stand 30 seconds x 2 trials, noticeable trembling of ankles/LE's with fatigue and challenge to maintain stability Airex pad: horizontal head turns 30 seconds scanning room 10x ; cueing for arc of motion  Airex pad: vertical head turns 30 seconds, cueing for arc of motion, noticeable sway with upward gaze increasing demand on ankle righting reaction musculature Airex pad: one foot on 6" step one foot on airex pad, hold position for 30 seconds, switch legs, 2x each LE;    PATIENT EDUCATION: Education details: goals, Pt educated on the purpose of PT, safety considerations Person educated: Patient Education method: Explanation, Demonstration, Tactile cues, and Verbal cues Education  comprehension: verbalized understanding, returned demonstration, verbal cues required, tactile cues required, and needs further education  HOME EXERCISE PROGRAM: Access Code: [email or web address] URL: [email or web address] Date: 11/26/2022 Prepared by: Precious Bard Exercises - Seated March  - 1 x daily - 7 x weekly - 2 sets - 10 reps - 5 hold   GOALS: Goals reviewed with patient? Yes  SHORT TERM GOALS: Target date:03/06/2023     Patient will be independent in home exercise program to improve strength/mobility for better functional independence with ADLs. Baseline: Given 6/10 8/21: intermittent compliance Goal status: Partially Met    LONG TERM GOALS: Target date: 05/01/2023    Patient will increase FOTO score to equal to or greater than 52 %   to demonstrate statistically significant improvement in mobility and quality of life.  Baseline: 5/29: 49% 8/21: 42% Goal status: IN PROGRESS  2.  Patient will increase Berg Balance score by > 6 points to demonstrate decreased fall risk during functional activities Baseline: 5/29: 32/56 8/21: 34/56  Goal status: IN PROGRESS  3.  Patient will increase 10 meter walk test to >1.62m/s as to improve gait speed for better community ambulation and to reduce fall risk. Baseline: 5/29: 0.64 w/ Hurry Cane in R hand and CGA 8/21: 0.67 m/s with walker  Goal status: IN PROGRESS  4.  Patient will increase six minute walk test distance to >1000 for progression to community ambulator and improve gait ability Baseline: 255' in 2\' 51"  w/ Hurry Cane in L hand and CGA 8/21: 605 ft with RW Goal status: IN PROGRESS    ASSESSMENT:  CLINICAL IMPRESSION: Patient tolerates progressive stabilization interventions well. She is eager to progress her mobility and stability but does fatigue easily requiring intermittent rest breaks.  Patient will benefit from skilled physical therapy to increase strength, stability, and increase functional capacity of mobility for  improved quality of life.  OBJECTIVE IMPAIRMENTS: Abnormal gait, decreased activity tolerance, decreased balance, decreased endurance, decreased knowledge of use of DME, decreased mobility, difficulty walking, decreased strength, improper body mechanics, postural dysfunction, and obesity.   ACTIVITY LIMITATIONS: carrying, bending, standing, squatting, stairs, transfers, reach over head, locomotion level, and caring for others  PARTICIPATION LIMITATIONS: meal prep, cleaning, laundry, shopping, community activity, and yard work  PERSONAL FACTORS: Age, Behavior pattern, Fitness, Past/current experiences, Profession, Sex, Time since onset of injury/illness/exacerbation, and 3+ comorbidities: HTN, HLD, Type II DM, urinary frequency, history of lyme disease, dysphonia, Phlebitis.   are also affecting patient's functional outcome.   REHAB POTENTIAL: Fair    CLINICAL DECISION MAKING: Evolving/moderate complexity  EVALUATION COMPLEXITY: Moderate  PLAN:  PT FREQUENCY: 2x/week  PT DURATION: 12 weeks  PLANNED INTERVENTIONS: Therapeutic exercises, Therapeutic activity, Neuromuscular re-education, Balance training, Gait training, Patient/Family education, Self Care, Joint mobilization, Stair training, Vestibular training, Canalith repositioning, Visual/preceptual remediation/compensation, DME instructions, Dry Needling, Cognitive remediation, Wheelchair mobility training, Spinal manipulation, Spinal mobilization, Cryotherapy, Moist heat, Splintting, Taping, Vasopneumatic device, Traction, Biofeedback, Manual therapy, and Re-evaluation  PLAN FOR NEXT SESSION: progress dynamic stability, foot clearance & stride length, ramp navigation  Precious Bard, PT 03/04/2023, 4:15 PM

## 2023-03-06 ENCOUNTER — Ambulatory Visit: Payer: Medicare Other

## 2023-03-10 NOTE — Therapy (Signed)
OUTPATIENT PHYSICAL THERAPY NEURO TREATMENT  Patient Name: Karen Frank MRN: [id number] DOB:1946/02/20, 77 y.o., female Today's Date: 03/11/2023   PCP: Wilford Corner PA REFERRING PROVIDER: Wilford Corner, PA  END OF SESSION:  PT End of Session - 03/11/23 1500     Visit Number 8    Number of Visits 29    Date for PT Re-Evaluation 05/01/23    Authorization Type 8/10 eval 11/14/22    PT Start Time 1500    PT Stop Time 1530    PT Time Calculation (min) 30 min    Equipment Utilized During Treatment Gait belt    Activity Tolerance Patient tolerated treatment well;Patient limited by fatigue    Behavior During Therapy [facility] for tasks assessed/performed                   History reviewed. No pertinent past medical history. History reviewed. No pertinent surgical history. There are no problems to display for this patient.   ONSET DATE: February 2024  REFERRING DIAG: imbalance  THERAPY DIAG:  Unsteadiness on feet  Difficulty in walking, not elsewhere classified  Muscle weakness (generalized)  Other abnormalities of gait and mobility  Rationale for Evaluation and Treatment: Rehabilitation  SUBJECTIVE:                                                                                                                                                                                             SUBJECTIVE STATEMENT: Patient reports no falls or LOB since last session. Walked over the weekend without an AD for short period of time.   Pt accompanied by: self  PERTINENT HISTORY: Patient is returning back to PT for balance deficits. She has been seen in this clinic in the past, was discharged due to no shows. Patient had a fall in November 2023. Uses a cane for ambulation of short distance.   PAIN:  Are you having pain? No  PRECAUTIONS: Fall  WEIGHT BEARING RESTRICTIONS: No  FALLS: Has patient fallen in last 6 months? Yes. Number of falls 1  LIVING  ENVIRONMENT: Lives with: lives alone Lives in: House/apartment Stairs: No Has following equipment at home: Single point cane, Walker - 2 wheeled, and shower chair  PLOF: Independent with basic ADLs  PATIENT GOALS: walk again, be able to go for long walks again  OBJECTIVE:   DIAGNOSTIC FINDINGS:  IMPRESSION: No acute intracranial abnormality.   IMPRESSION: (06/22/21) 1. Ventriculomegaly, slightly progressed from 06/15/2014. This does not appear significantly disproportionate to the cerebral sulci. This however does not exclude the diagnosis of normal pressure hydrocephalus in the appropriate context. 2. Moderate findings  of follow-up chronic small vessel disease as further detailed above. 3. Findings consistent with sequelae of remote microhemorrhage in the left posterior frontal lobe.   CT Lumbar spine without contrast 05/07/2021: 1. No acute fracture or listhesis. There is chronic-appearing anterolisthesis of L4 on L5. 2. 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    COGNITION: Overall cognitive status: Within functional limits for tasks assessed   SENSATION: WFL  COORDINATION: Heel shin test: WFL Nose finger: decreased L; WFL    POSTURE: rounded shoulders and forward head    LOWER EXTREMITY MMT:    MMT Right Eval Left Eval  Hip flexion 4 4+  Hip extension    Hip abduction 3+ 3+  Hip adduction 3 4-  Hip internal rotation    Hip external rotation    Knee flexion 3+ 3+  Knee extension 4- 4  Ankle dorsiflexion 4- 4-  Ankle plantarflexion 4- 4-  Ankle inversion    Ankle eversion    (Blank rows = not tested)  BED MOBILITY:  Patient reports independent  TRANSFERS: Assistive device utilized:  hurrycane   Sit to stand: CGA Stand to sit: CGA Chair to chair: CGA   RAMP:  *address in future session  CURB:  *address in future session    GAIT: Gait pattern: decreased arm swing- Right, decreased arm swing- Left, decreased step length- Right, decreased  step length- Left, decreased stride length, decreased hip/knee flexion- Right, decreased hip/knee flexion- Left, poor foot clearance- Right, and poor foot clearance- Left Distance walked: ~50' w/ CGA and Hurry [employer] device utilized:  Hurry cane Level of assistance: CGA Comments: Pt held [employer] in [employer], PT educated pt on proper technique for increased safety, and advised pt to switch cane to L side.    FUNCTIONAL TESTS:  5 times sit to stand: 11.1 sec 6 minute walk test: 255' in 2\' 51"  w/ Hurry Cane in L hand and CGA 10 meter walk test: 0.64 w/ Hurry Cane in R hand and CGA (see above)   Berg Balance Scale: 32/56  PATIENT SURVEYS:  FOTO 44, predicted d/c 52  TODAY'S TREATMENT:                                                                                                                              DATE: 03/11/23  Unless otherwise stated, CGA was provided and gait belt donned in order to ensure pt safety.    TherEx:  Seated: RTB abduction 15x Seated alphabet each LE   Superset : 2 sets Ambulate with RW with 3lb ankle weight: 160 ft; cues for foot clearance  Sit to stand 10x; UE support intermittent.   Neuro RE-ed:   Standing with CGA next to support surface:  Airex pad: static stand 30 seconds x 2 trials, noticeable trembling of ankles/LE's with fatigue and challenge to maintain stability Airex pad: horizontal head turns  scanning room 10x ; cueing for arc of motion  Airex pad:  vertical head turns 10x cueing for arc of motion, noticeable sway with upward gaze increasing demand on ankle righting reaction musculature Airex pad: one foot on 6" step one foot on airex pad, hold position for 30 seconds, switch legs, 2x each LE;      PATIENT EDUCATION: Education details: goals, Pt educated on the purpose of PT, safety considerations Person educated: Patient Education method: Explanation, Demonstration, Tactile cues, and Verbal cues Education comprehension:  verbalized understanding, returned demonstration, verbal cues required, tactile cues required, and needs further education  HOME EXERCISE PROGRAM: Access Code: [email or web address] URL: [email or web address] Date: 11/26/2022 Prepared by: Precious Bard Exercises - Seated March  - 1 x daily - 7 x weekly - 2 sets - 10 reps - 5 hold   GOALS: Goals reviewed with patient? Yes  SHORT TERM GOALS: Target date:03/06/2023     Patient will be independent in home exercise program to improve strength/mobility for better functional independence with ADLs. Baseline: Given 6/10 8/21: intermittent compliance Goal status: Partially Met    LONG TERM GOALS: Target date: 05/01/2023    Patient will increase FOTO score to equal to or greater than 52 %   to demonstrate statistically significant improvement in mobility and quality of life.  Baseline: 5/29: 49% 8/21: 42% Goal status: IN PROGRESS  2.  Patient will increase Berg Balance score by > 6 points to demonstrate decreased fall risk during functional activities Baseline: 5/29: 32/56 8/21: 34/56  Goal status: IN PROGRESS  3.  Patient will increase 10 meter walk test to >1.65m/s as to improve gait speed for better community ambulation and to reduce fall risk. Baseline: 5/29: 0.64 w/ Hurry Cane in R hand and CGA 8/21: 0.67 m/s with walker  Goal status: IN PROGRESS  4.  Patient will increase six minute walk test distance to >1000 for progression to community ambulator and improve gait ability Baseline: 255' in 2\' 51"  w/ Hurry Cane in L hand and CGA 8/21: 605 ft with RW Goal status: IN PROGRESS    ASSESSMENT:  CLINICAL IMPRESSION: Patient session limited by late arrival. Is eager to perform tasks during session, tolerates superset with fatigue. She is unstable on airex pad this session requiring continued focus on balance. Patient will benefit from skilled physical therapy to increase strength, stability, and increase functional capacity of mobility  for improved quality of life.  OBJECTIVE IMPAIRMENTS: Abnormal gait, decreased activity tolerance, decreased balance, decreased endurance, decreased knowledge of use of DME, decreased mobility, difficulty walking, decreased strength, improper body mechanics, postural dysfunction, and obesity.   ACTIVITY LIMITATIONS: carrying, bending, standing, squatting, stairs, transfers, reach over head, locomotion level, and caring for others  PARTICIPATION LIMITATIONS: meal prep, cleaning, laundry, shopping, community activity, and yard work  PERSONAL FACTORS: Age, Behavior pattern, Fitness, Past/current experiences, Profession, Sex, Time since onset of injury/illness/exacerbation, and 3+ comorbidities: HTN, HLD, Type II DM, urinary frequency, history of lyme disease, dysphonia, Phlebitis.   are also affecting patient's functional outcome.   REHAB POTENTIAL: Fair    CLINICAL DECISION MAKING: Evolving/moderate complexity  EVALUATION COMPLEXITY: Moderate  PLAN:  PT FREQUENCY: 2x/week  PT DURATION: 12 weeks  PLANNED INTERVENTIONS: Therapeutic exercises, Therapeutic activity, Neuromuscular re-education, Balance training, Gait training, Patient/Family education, Self Care, Joint mobilization, Stair training, Vestibular training, Canalith repositioning, Visual/preceptual remediation/compensation, DME instructions, Dry Needling, Cognitive remediation, Wheelchair mobility training, Spinal manipulation, Spinal mobilization, Cryotherapy, Moist heat, Splintting, Taping, Vasopneumatic device, Traction, Biofeedback, Manual therapy, and Re-evaluation  PLAN FOR NEXT SESSION: progress dynamic stability, foot clearance & stride length,  ramp navigation     [place], [place] 03/11/2023, 3:30 PM

## 2023-03-11 ENCOUNTER — Ambulatory Visit: Payer: Medicare Other

## 2023-03-11 DIAGNOSIS — R262 Difficulty in walking, not elsewhere classified: Secondary | ICD-10-CM

## 2023-03-11 DIAGNOSIS — R2681 Unsteadiness on feet: Secondary | ICD-10-CM

## 2023-03-11 DIAGNOSIS — M6281 Muscle weakness (generalized): Secondary | ICD-10-CM

## 2023-03-11 DIAGNOSIS — R2689 Other abnormalities of gait and mobility: Secondary | ICD-10-CM

## 2023-03-13 ENCOUNTER — Ambulatory Visit: Payer: Medicare Other

## 2023-03-18 ENCOUNTER — Ambulatory Visit: Payer: Medicare Other

## 2023-03-20 ENCOUNTER — Ambulatory Visit: Payer: Medicare Other

## 2023-03-25 ENCOUNTER — Ambulatory Visit: Payer: Medicare Other | Attending: Family Medicine

## 2023-03-25 DIAGNOSIS — R2681 Unsteadiness on feet: Secondary | ICD-10-CM | POA: Diagnosis present

## 2023-03-25 DIAGNOSIS — M6281 Muscle weakness (generalized): Secondary | ICD-10-CM | POA: Insufficient documentation

## 2023-03-25 DIAGNOSIS — R262 Difficulty in walking, not elsewhere classified: Secondary | ICD-10-CM | POA: Insufficient documentation

## 2023-03-25 DIAGNOSIS — R2689 Other abnormalities of gait and mobility: Secondary | ICD-10-CM | POA: Insufficient documentation

## 2023-03-25 NOTE — Therapy (Signed)
OUTPATIENT PHYSICAL THERAPY NEURO TREATMENT  Patient Name: Karen Frank MRN: [id number] DOB:[birth year], 77 y.o., female Today's Date: 03/25/2023   PCP: Wilford Corner PA REFERRING PROVIDER: Wilford Corner, PA  END OF SESSION:  PT End of Session - 03/25/23 1616     Visit Number 9    Number of Visits 29    Date for PT Re-Evaluation 05/01/23    Authorization Type 9/10 eval 11/14/22    PT Start Time 1616    PT Stop Time 1729    PT Time Calculation (min) 73 min    Equipment Utilized During Treatment Gait belt    Activity Tolerance Patient tolerated treatment well;Patient limited by fatigue    Behavior During Therapy [facility] for tasks assessed/performed                   History reviewed. No pertinent past medical history. History reviewed. No pertinent surgical history. There are no problems to display for this patient.   ONSET DATE: February 2024  REFERRING DIAG: imbalance  THERAPY DIAG:  Unsteadiness on feet  Difficulty in walking, not elsewhere classified  Muscle weakness (generalized)  Other abnormalities of gait and mobility  Rationale for Evaluation and Treatment: Rehabilitation  SUBJECTIVE:                                                                                                                                                                                             SUBJECTIVE STATEMENT: Patient reports bilateral wrist pain, are wrapped up today. No falls or LOB since last session. Reports she hasn't been compliant with HEP   Pt accompanied by: self  PERTINENT HISTORY: Patient is returning back to PT for balance deficits. She has been seen in this clinic in the past, was discharged due to no shows. Patient had a fall in November 2023. Uses a cane for ambulation of short distance.   PAIN:  Are you having pain? No  PRECAUTIONS: Fall  WEIGHT BEARING RESTRICTIONS: No  FALLS: Has patient fallen in last 6 months? Yes. Number of  falls 1  LIVING ENVIRONMENT: Lives with: lives alone Lives in: House/apartment Stairs: No Has following equipment at home: Single point cane, Walker - 2 wheeled, and shower chair  PLOF: Independent with basic ADLs  PATIENT GOALS: walk again, be able to go for long walks again  OBJECTIVE:   DIAGNOSTIC FINDINGS:  IMPRESSION: No acute intracranial abnormality.   IMPRESSION: (06/22/21) 1. Ventriculomegaly, slightly progressed from 06/15/2014. This does not appear significantly disproportionate to the cerebral sulci. This however does not exclude the diagnosis of normal pressure hydrocephalus in the appropriate context. 2.  Moderate findings of follow-up chronic small vessel disease as further detailed above. 3. Findings consistent with sequelae of remote microhemorrhage in the left posterior frontal lobe.   CT Lumbar spine without contrast 05/07/2021: 1. No acute fracture or listhesis. There is chronic-appearing anterolisthesis of L4 on L5. 2. 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    COGNITION: Overall cognitive status: Within functional limits for tasks assessed   SENSATION: WFL  COORDINATION: Heel shin test: WFL Nose finger: decreased L; WFL    POSTURE: rounded shoulders and forward head    LOWER EXTREMITY MMT:    MMT Right Eval Left Eval  Hip flexion 4 4+  Hip extension    Hip abduction 3+ 3+  Hip adduction 3 4-  Hip internal rotation    Hip external rotation    Knee flexion 3+ 3+  Knee extension 4- 4  Ankle dorsiflexion 4- 4-  Ankle plantarflexion 4- 4-  Ankle inversion    Ankle eversion    (Blank rows = not tested)  BED MOBILITY:  Patient reports independent  TRANSFERS: Assistive device utilized:  hurrycane   Sit to stand: CGA Stand to sit: CGA Chair to chair: CGA   RAMP:  *address in future session  CURB:  *address in future session    GAIT: Gait pattern: decreased arm swing- Right, decreased arm swing- Left, decreased step length-  Right, decreased step length- Left, decreased stride length, decreased hip/knee flexion- Right, decreased hip/knee flexion- Left, poor foot clearance- Right, and poor foot clearance- Left Distance walked: ~50' w/ CGA and Hurry [employer] device utilized:  Hurry cane Level of assistance: CGA Comments: Pt held [employer] in [employer], PT educated pt on proper technique for increased safety, and advised pt to switch cane to L side.    FUNCTIONAL TESTS:  5 times sit to stand: 11.1 sec 6 minute walk test: 255' in 2\' 51"  w/ Hurry Cane in L hand and CGA 10 meter walk test: 0.64 w/ Hurry Cane in R hand and CGA (see above)   Berg Balance Scale: 32/56  PATIENT SURVEYS:  FOTO 44, predicted d/c 52  TODAY'S TREATMENT:                                                                                                                              DATE: 03/25/23  Unless otherwise stated, CGA was provided and gait belt donned in order to ensure pt safety.    TherEx: Circuit 1: 3 sets  2lb ankle weights: - LAQ 10x each LE -march 10x each LE -Sit to stand 10x  -ambulate 160 ft with RW and CGA  Neuro RE-ed:   Standing with CGA next to support surface:  Airex pad: static stand 30 seconds x 2 trials, noticeable trembling of ankles/LE's with fatigue and challenge to maintain stability Airex pad: static stand eyes closed 30 seconds x 2 trials  Airex pad: horizontal head turns  scanning room 30 seconds ; cueing  for arc of motion  Airex pad: vertical head turns 30 seconds cueing for arc of motion, noticeable sway with upward gaze increasing demand on ankle righting reaction musculature Airex pad: sorting letters by color for reaching and stabilization while on unstable surface 2 mins 32 seconds   6" step:reduced to 4" step due to inability to perform -toe taps with close CGA 10x each side x2 sets    PATIENT EDUCATION: Education details: goals, Pt educated on the purpose of PT, safety  considerations Person educated: Patient Education method: Explanation, Demonstration, Tactile cues, and Verbal cues Education comprehension: verbalized understanding, returned demonstration, verbal cues required, tactile cues required, and needs further education  HOME EXERCISE PROGRAM: Access Code: [email or web address] URL: [email or web address] Date: 11/26/2022 Prepared by: Precious Bard Exercises - Seated March  - 1 x daily - 7 x weekly - 2 sets - 10 reps - 5 hold   GOALS: Goals reviewed with patient? Yes  SHORT TERM GOALS: Target date:03/06/2023     Patient will be independent in home exercise program to improve strength/mobility for better functional independence with ADLs. Baseline: Given 6/10 8/21: intermittent compliance Goal status: Partially Met    LONG TERM GOALS: Target date: 05/01/2023    Patient will increase FOTO score to equal to or greater than 52 %   to demonstrate statistically significant improvement in mobility and quality of life.  Baseline: 5/29: 49% 8/21: 42% Goal status: IN PROGRESS  2.  Patient will increase Berg Balance score by > 6 points to demonstrate decreased fall risk during functional activities Baseline: 5/29: 32/56 8/21: 34/56  Goal status: IN PROGRESS  3.  Patient will increase 10 meter walk test to >1.66m/s as to improve gait speed for better community ambulation and to reduce fall risk. Baseline: 5/29: 0.64 w/ Hurry Cane in R hand and CGA 8/21: 0.67 m/s with walker  Goal status: IN PROGRESS  4.  Patient will increase six minute walk test distance to >1000 for progression to community ambulator and improve gait ability Baseline: 255' in 2\' 51"  w/ Hurry Cane in L hand and CGA 8/21: 605 ft with RW Goal status: IN PROGRESS    ASSESSMENT:  CLINICAL IMPRESSION: Patient tolerates circuit well with fatigue requiring rest breaks between sets. She is eager to progress her functional mobility and stability throughout session. She is challenged  by eyes closed on unstable surface indicating continued need for focused stability exercises. Patient will benefit from skilled physical therapy to increase strength, stability, and increase functional capacity of mobility for improved quality of life.  OBJECTIVE IMPAIRMENTS: Abnormal gait, decreased activity tolerance, decreased balance, decreased endurance, decreased knowledge of use of DME, decreased mobility, difficulty walking, decreased strength, improper body mechanics, postural dysfunction, and obesity.   ACTIVITY LIMITATIONS: carrying, bending, standing, squatting, stairs, transfers, reach over head, locomotion level, and caring for others  PARTICIPATION LIMITATIONS: meal prep, cleaning, laundry, shopping, community activity, and yard work  PERSONAL FACTORS: Age, Behavior pattern, Fitness, Past/current experiences, Profession, Sex, Time since onset of injury/illness/exacerbation, and 3+ comorbidities: HTN, HLD, Type II DM, urinary frequency, history of lyme disease, dysphonia, Phlebitis.   are also affecting patient's functional outcome.   REHAB POTENTIAL: Fair    CLINICAL DECISION MAKING: Evolving/moderate complexity  EVALUATION COMPLEXITY: Moderate  PLAN:  PT FREQUENCY: 2x/week  PT DURATION: 12 weeks  PLANNED INTERVENTIONS: Therapeutic exercises, Therapeutic activity, Neuromuscular re-education, Balance training, Gait training, Patient/Family education, Self Care, Joint mobilization, Stair training, Vestibular training, Canalith repositioning, Visual/preceptual remediation/compensation, DME instructions, Dry Needling, Cognitive remediation,  Wheelchair mobility training, Spinal manipulation, Spinal mobilization, Cryotherapy, Moist heat, Splintting, Taping, Vasopneumatic device, Traction, Biofeedback, Manual therapy, and Re-evaluation  PLAN FOR NEXT SESSION: progress dynamic stability, foot clearance & stride length, ramp navigation     Precious Bard, PT 03/25/2023, 5:04  PM

## 2023-03-27 ENCOUNTER — Ambulatory Visit: Payer: Medicare Other

## 2023-04-01 ENCOUNTER — Ambulatory Visit: Payer: Medicare Other

## 2023-04-03 ENCOUNTER — Ambulatory Visit: Payer: Medicare Other

## 2023-04-04 NOTE — Therapy (Signed)
OUTPATIENT PHYSICAL THERAPY NEURO TREATMENT/ Physical Therapy Progress Note   Dates of reporting period  10/25/22   to   04/08/23    Patient Name: Karen Frank MRN: [id number] DOB:10/01/1945, 77 y.o., female Today's Date: 04/08/2023   PCP: Wilford Corner PA REFERRING PROVIDER: Wilford Corner, PA  END OF SESSION:  PT End of Session - 04/08/23 1445     Visit Number 10    Number of Visits 29    Date for PT Re-Evaluation 05/01/23    Authorization Type 10/10 eval 11/14/22    PT Start Time 1440    PT Stop Time 1525    PT Time Calculation (min) 45 min    Equipment Utilized During Treatment Gait belt    Activity Tolerance Patient tolerated treatment well;Patient limited by fatigue    Behavior During Therapy [facility] for tasks assessed/performed                    History reviewed. No pertinent past medical history. History reviewed. No pertinent surgical history. There are no problems to display for this patient.   ONSET DATE: February 2024  REFERRING DIAG: imbalance  THERAPY DIAG:  Unsteadiness on feet  Difficulty in walking, not elsewhere classified  Muscle weakness (generalized)  Other abnormalities of gait and mobility  Rationale for Evaluation and Treatment: Rehabilitation  SUBJECTIVE:                                                                                                                                                                                             SUBJECTIVE STATEMENT: Patient presents after a week hiatus due to not feeling well.   Pt accompanied by: self  PERTINENT HISTORY: Patient is returning back to PT for balance deficits. She has been seen in this clinic in the past, was discharged due to no shows. Patient had a fall in November 2023. Uses a cane for ambulation of short distance.   PAIN:  Are you having pain? No  PRECAUTIONS: Fall  WEIGHT BEARING RESTRICTIONS: No  FALLS: Has patient fallen in last 6  months? Yes. Number of falls 1  LIVING ENVIRONMENT: Lives with: lives alone Lives in: House/apartment Stairs: No Has following equipment at home: Single point cane, Walker - 2 wheeled, and shower chair  PLOF: Independent with basic ADLs  PATIENT GOALS: walk again, be able to go for long walks again  OBJECTIVE:   DIAGNOSTIC FINDINGS:  IMPRESSION: No acute intracranial abnormality.   IMPRESSION: (06/22/21) 1. Ventriculomegaly, slightly progressed from 06/15/2014. This does not appear significantly disproportionate to the cerebral sulci. This however does not exclude the diagnosis  of normal pressure hydrocephalus in the appropriate context. 2. Moderate findings of follow-up chronic small vessel disease as further detailed above. 3. Findings consistent with sequelae of remote microhemorrhage in the left posterior frontal lobe.   CT Lumbar spine without contrast 05/07/2021: 1. No acute fracture or listhesis. There is chronic-appearing anterolisthesis of L4 on L5. 2. 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    COGNITION: Overall cognitive status: Within functional limits for tasks assessed   SENSATION: WFL  COORDINATION: Heel shin test: WFL Nose finger: decreased L; WFL    POSTURE: rounded shoulders and forward head    LOWER EXTREMITY MMT:    MMT Right Eval Left Eval  Hip flexion 4 4+  Hip extension    Hip abduction 3+ 3+  Hip adduction 3 4-  Hip internal rotation    Hip external rotation    Knee flexion 3+ 3+  Knee extension 4- 4  Ankle dorsiflexion 4- 4-  Ankle plantarflexion 4- 4-  Ankle inversion    Ankle eversion    (Blank rows = not tested)  BED MOBILITY:  Patient reports independent  TRANSFERS: Assistive device utilized:  hurrycane   Sit to stand: CGA Stand to sit: CGA Chair to chair: CGA   RAMP:  *address in future session  CURB:  *address in future session    GAIT: Gait pattern: decreased arm swing- Right, decreased arm swing- Left,  decreased step length- Right, decreased step length- Left, decreased stride length, decreased hip/knee flexion- Right, decreased hip/knee flexion- Left, poor foot clearance- Right, and poor foot clearance- Left Distance walked: ~50' w/ CGA and Hurry [employer] device utilized:  Hurry cane Level of assistance: CGA Comments: Pt held [employer] in [employer], PT educated pt on proper technique for increased safety, and advised pt to switch cane to L side.    FUNCTIONAL TESTS:  5 times sit to stand: 11.1 sec 6 minute walk test: 255' in 2\' 51"  w/ Hurry Cane in L hand and CGA 10 meter walk test: 0.64 w/ Hurry Cane in R hand and CGA (see above)   Berg Balance Scale: 32/56  PATIENT SURVEYS:  FOTO 44, predicted d/c 52  TODAY'S TREATMENT:                                                                                                                              DATE: 04/08/23  Unless otherwise stated, CGA was provided and gait belt donned in order to ensure pt safety.   Goals performed: see below for details :   TherEx: RTB hamstring curl 10x each LE  RTB abduction 15x, BTB 15x   Neuro RE-ed:   Standing with CGA next to support surface:  Airex pad: static stand 30 seconds x 2 trials, noticeable trembling of ankles/LE's with fatigue and challenge to maintain stability Airex pad: static stand eyes closed 30 seconds x 2 trials  Airex pad: horizontal head turns  scanning room 30 seconds ;  cueing for arc of motion  Airex pad: vertical head turns 10x cueing for arc of motion, noticeable sway with upward gaze increasing demand on ankle righting reaction musculature     PATIENT EDUCATION: Education details: goals, Pt educated on the purpose of PT, safety considerations Person educated: Patient Education method: Explanation, Demonstration, Tactile cues, and Verbal cues Education comprehension: verbalized understanding, returned demonstration, verbal cues required, tactile cues required, and  needs further education  HOME EXERCISE PROGRAM: Access Code: [email or web address] URL: [email or web address] Date: 11/26/2022 Prepared by: Precious Bard Exercises - Seated March  - 1 x daily - 7 x weekly - 2 sets - 10 reps - 5 hold   GOALS: Goals reviewed with patient? Yes  SHORT TERM GOALS: Target date:03/06/2023     Patient will be independent in home exercise program to improve strength/mobility for better functional independence with ADLs. Baseline: Given 6/10 8/21: intermittent compliance Goal status: Partially Met    LONG TERM GOALS: Target date: 05/01/2023    Patient will increase FOTO score to equal to or greater than 52 %   to demonstrate statistically significant improvement in mobility and quality of life.  Baseline: 5/29: 49% 8/21: 42% 10/21: 63%  Goal status: MET  2.  Patient will increase Berg Balance score by > 6 points to demonstrate decreased fall risk during functional activities Baseline: 5/29: 32/56 8/21: 34/56 10/21: 37/56  Goal status: IN PROGRESS  3.  Patient will increase 10 meter walk test to >1.45m/s as to improve gait speed for better community ambulation and to reduce fall risk. Baseline: 5/29: 0.64 w/ Hurry Cane in R hand and CGA 8/21: 0.67 m/s with walker 10/21: 1.0 m/s with walker  Goal status: MET  4.  Patient will increase six minute walk test distance to >1000 for progression to community ambulator and improve gait ability Baseline: 255' in 2\' 51"  w/ Hurry Cane in L hand and CGA 8/21: 605 ft with RW 10/21: 808 ft with RW  Goal status: IN PROGRESS    ASSESSMENT:  CLINICAL IMPRESSION:  Patient's condition has the potential to improve in response to therapy. Maximum improvement is yet to be obtained. The anticipated improvement is attainable and reasonable in a generally predictable time.   Patient met her 10 MWT goal and has had significant improvement in her balance as can be determined by BERG score. She has met her FOTO goal.  Patient  will benefit from skilled physical therapy to increase strength, stability, and increase functional capacity of mobility for improved quality of life.  OBJECTIVE IMPAIRMENTS: Abnormal gait, decreased activity tolerance, decreased balance, decreased endurance, decreased knowledge of use of DME, decreased mobility, difficulty walking, decreased strength, improper body mechanics, postural dysfunction, and obesity.   ACTIVITY LIMITATIONS: carrying, bending, standing, squatting, stairs, transfers, reach over head, locomotion level, and caring for others  PARTICIPATION LIMITATIONS: meal prep, cleaning, laundry, shopping, community activity, and yard work  PERSONAL FACTORS: Age, Behavior pattern, Fitness, Past/current experiences, Profession, Sex, Time since onset of injury/illness/exacerbation, and 3+ comorbidities: HTN, HLD, Type II DM, urinary frequency, history of lyme disease, dysphonia, Phlebitis.   are also affecting patient's functional outcome.   REHAB POTENTIAL: Fair    CLINICAL DECISION MAKING: Evolving/moderate complexity  EVALUATION COMPLEXITY: Moderate  PLAN:  PT FREQUENCY: 2x/week  PT DURATION: 12 weeks  PLANNED INTERVENTIONS: Therapeutic exercises, Therapeutic activity, Neuromuscular re-education, Balance training, Gait training, Patient/Family education, Self Care, Joint mobilization, Stair training, Vestibular training, Canalith repositioning, Visual/preceptual remediation/compensation, DME instructions, Dry Needling, Cognitive remediation, Wheelchair mobility training,  Spinal manipulation, Spinal mobilization, Cryotherapy, Moist heat, Splintting, Taping, Vasopneumatic device, Traction, Biofeedback, Manual therapy, and Re-evaluation  PLAN FOR NEXT SESSION: progress dynamic stability, foot clearance & stride length, ramp navigation     Precious Bard, PT 04/08/2023, 3:32 PM

## 2023-04-08 ENCOUNTER — Ambulatory Visit: Payer: Medicare Other

## 2023-04-08 DIAGNOSIS — R2689 Other abnormalities of gait and mobility: Secondary | ICD-10-CM

## 2023-04-08 DIAGNOSIS — R2681 Unsteadiness on feet: Secondary | ICD-10-CM | POA: Diagnosis not present

## 2023-04-08 DIAGNOSIS — R262 Difficulty in walking, not elsewhere classified: Secondary | ICD-10-CM

## 2023-04-08 DIAGNOSIS — M6281 Muscle weakness (generalized): Secondary | ICD-10-CM

## 2023-04-10 ENCOUNTER — Ambulatory Visit: Payer: Medicare Other

## 2023-04-15 ENCOUNTER — Ambulatory Visit: Payer: Medicare Other

## 2023-04-16 ENCOUNTER — Ambulatory Visit: Payer: Medicare Other

## 2023-04-22 ENCOUNTER — Ambulatory Visit: Payer: Medicare Other | Attending: Family Medicine

## 2023-04-22 DIAGNOSIS — R2681 Unsteadiness on feet: Secondary | ICD-10-CM | POA: Diagnosis present

## 2023-04-22 DIAGNOSIS — R2689 Other abnormalities of gait and mobility: Secondary | ICD-10-CM | POA: Diagnosis present

## 2023-04-22 DIAGNOSIS — R262 Difficulty in walking, not elsewhere classified: Secondary | ICD-10-CM | POA: Diagnosis present

## 2023-04-22 DIAGNOSIS — M6281 Muscle weakness (generalized): Secondary | ICD-10-CM | POA: Diagnosis present

## 2023-04-22 NOTE — Therapy (Signed)
OUTPATIENT PHYSICAL THERAPY NEURO TREATMENT    Patient Name: Karen Frank MRN: [id number] DOB:1946-03-15, 77 y.o., female Today's Date: 04/22/2023   PCP: Wilford Corner PA REFERRING PROVIDER: Wilford Corner, PA  END OF SESSION:  PT End of Session - 04/22/23 1540     Visit Number 11    Number of Visits 29    Date for PT Re-Evaluation 05/01/23    Authorization Type 10/10 eval 11/14/22    PT Start Time 1445    PT Stop Time 1529    PT Time Calculation (min) 44 min    Equipment Utilized During Treatment Gait belt    Activity Tolerance Patient tolerated treatment well    Behavior During Therapy WFL for tasks assessed/performed                     History reviewed. No pertinent past medical history. History reviewed. No pertinent surgical history. There are no problems to display for this patient.   ONSET DATE: February 2024  REFERRING DIAG: imbalance  THERAPY DIAG:  Unsteadiness on feet  Difficulty in walking, not elsewhere classified  Muscle weakness (generalized)  Other abnormalities of gait and mobility  Rationale for Evaluation and Treatment: Rehabilitation  SUBJECTIVE:                                                                                                                                                                                             SUBJECTIVE STATEMENT: Patient reports no falls or changes to medications since her last visit. She states she has been doing some walking around the house without an assistive device or furniture surfing.   Pt accompanied by: self  PERTINENT HISTORY: Patient is returning back to PT for balance deficits. She has been seen in this clinic in the past, was discharged due to no shows. Patient had a fall in November 2023. Uses a cane for ambulation of short distance.   PAIN:  Are you having pain? No  PRECAUTIONS: Fall  WEIGHT BEARING RESTRICTIONS: No  FALLS: Has patient fallen in  last 6 months? Yes. Number of falls 1  LIVING ENVIRONMENT: Lives with: lives alone Lives in: House/apartment Stairs: No Has following equipment at home: Single point cane, Iqra Rotundo - 2 wheeled, and shower chair  PLOF: Independent with basic ADLs  PATIENT GOALS: walk again, be able to go for long walks again  OBJECTIVE:   DIAGNOSTIC FINDINGS:  IMPRESSION: No acute intracranial abnormality.   IMPRESSION: (06/22/21) 1. Ventriculomegaly, slightly progressed from 06/15/2014. This does not appear significantly disproportionate to the cerebral sulci. This however does not exclude the diagnosis of  normal pressure hydrocephalus in the appropriate context. 2. Moderate findings of follow-up chronic small vessel disease as further detailed above. 3. Findings consistent with sequelae of remote microhemorrhage in the left posterior frontal lobe.   CT Lumbar spine without contrast 05/07/2021: 1. No acute fracture or listhesis. There is chronic-appearing anterolisthesis of L4 on L5. 2. 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    COGNITION: Overall cognitive status: Within functional limits for tasks assessed   SENSATION: WFL  COORDINATION: Heel shin test: WFL Nose finger: decreased L; WFL    POSTURE: rounded shoulders and forward head    LOWER EXTREMITY MMT:    MMT Right Eval Left Eval  Hip flexion 4 4+  Hip extension    Hip abduction 3+ 3+  Hip adduction 3 4-  Hip internal rotation    Hip external rotation    Knee flexion 3+ 3+  Knee extension 4- 4  Ankle dorsiflexion 4- 4-  Ankle plantarflexion 4- 4-  Ankle inversion    Ankle eversion    (Blank rows = not tested)  BED MOBILITY:  Patient reports independent  TRANSFERS: Assistive device utilized:  hurrycane   Sit to stand: CGA Stand to sit: CGA Chair to chair: CGA   RAMP:  *address in future session  CURB:  *address in future session    GAIT: Gait pattern: decreased arm swing- Right, decreased arm swing-  Left, decreased step length- Right, decreased step length- Left, decreased stride length, decreased hip/knee flexion- Right, decreased hip/knee flexion- Left, poor foot clearance- Right, and poor foot clearance- Left Distance walked: ~50' w/ CGA and Hurry [employer] device utilized:  Hurry cane Level of assistance: CGA Comments: Pt held [employer] in [employer], PT educated pt on proper technique for increased safety, and advised pt to switch cane to L side.    FUNCTIONAL TESTS:  5 times sit to stand: 11.1 sec 6 minute walk test: 255' in 2\' 51"  w/ Hurry Cane in L hand and CGA 10 meter walk test: 0.64 w/ Hurry Cane in R hand and CGA (see above)   Berg Balance Scale: 32/56  PATIENT SURVEYS:  FOTO 44, predicted d/c 52  TODAY'S TREATMENT:                                                                                                                              DATE: 04/22/23  Unless otherwise stated, CGA was provided and gait belt donned in order to ensure pt safety.    TherEx: Seated hamstring curls w/ RTB 2x10 each LE Seated marches 2.5# aw 2x20  Long arc quad 2.5# aw 2x20  Seated lateral hurdle step overs 1x15 each LE 3x5 STS, no UE Walked lap with 2.5 #aw x4, rated two laps a medium  Neuro RE-ed:   Standing with CGA next to support surface:  Airex pad: static stand 30 seconds, noticeable trembling of ankles/LE's with fatigue and challenge to maintain stability Airex pad: static stand  eyes closed 30 seconds, noticeable trembling of ankles/LE's with fatigue and challenge to maintain stability Airex pad: horizontal head turns scanning room 30 seconds; Airex pad: vertical head turns 30s Airex pad: Split stance with 6 inch step, x30 seconds, two trials one with each LE in front, minimal swaying, easier with left foot in front     PATIENT EDUCATION: Education details: goals, POC, safety considerations Person educated: Patient Education method: Explanation, Demonstration, Tactile  cues, and Verbal cues Education comprehension: verbalized understanding, returned demonstration, verbal cues required, tactile cues required, and needs further education  HOME EXERCISE PROGRAM: Access Code: [email or web address] URL: [email or web address] Date: 11/26/2022 Prepared by: Precious Bard Exercises - Seated March  - 1 x daily - 7 x weekly - 2 sets - 10 reps - 5 hold   GOALS: Goals reviewed with patient? Yes  SHORT TERM GOALS: Target date:03/06/2023     Patient will be independent in home exercise program to improve strength/mobility for better functional independence with ADLs. Baseline: Given 6/10 8/21: intermittent compliance Goal status: Partially Met    LONG TERM GOALS: Target date: 05/01/2023    Patient will increase FOTO score to equal to or greater than 52 %   to demonstrate statistically significant improvement in mobility and quality of life.  Baseline: 5/29: 49% 8/21: 42% 10/21: 63%  Goal status: MET  2.  Patient will increase Berg Balance score by > 6 points to demonstrate decreased fall risk during functional activities Baseline: 5/29: 32/56 8/21: 34/56 10/21: 37/56  Goal status: IN PROGRESS  3.  Patient will increase 10 meter walk test to >1.25m/s as to improve gait speed for better community ambulation and to reduce fall risk. Baseline: 5/29: 0.64 w/ Hurry Cane in R hand and CGA 8/21: 0.67 m/s with Mahira Petras 10/21: 1.0 m/s with Jaymison Luber  Goal status: MET  4.  Patient will increase six minute walk test distance to >1000 for progression to community ambulator and improve gait ability Baseline: 255' in 2\' 51"  w/ Hurry Cane in L hand and CGA 8/21: 605 ft with RW 10/21: 808 ft with RW  Goal status: IN PROGRESS    ASSESSMENT:  CLINICAL IMPRESSION: Patient presents to therapy motivated and continuing to make progress towards her goals. Today's treatment session focused on LE strengthening, endurance, and balance. She ambulates with poor foot clearance and as  fatigue increases the amount of foot clearance decreases. She was able to ambulate greater than four hundred feet with ankle weights demonstrating an improvement in endurance. She still presents with impaired balance evidenced by trembling of LE's with Airex activities. Patient will benefit from skilled physical therapy to increase strength, stability, and increase functional capacity of mobility for improved quality of life.  OBJECTIVE IMPAIRMENTS: Abnormal gait, decreased activity tolerance, decreased balance, decreased endurance, decreased knowledge of use of DME, decreased mobility, difficulty walking, decreased strength, improper body mechanics, postural dysfunction, and obesity.   ACTIVITY LIMITATIONS: carrying, bending, standing, squatting, stairs, transfers, reach over head, locomotion level, and caring for others  PARTICIPATION LIMITATIONS: meal prep, cleaning, laundry, shopping, community activity, and yard work  PERSONAL FACTORS: Age, Behavior pattern, Fitness, Past/current experiences, Profession, Sex, Time since onset of injury/illness/exacerbation, and 3+ comorbidities: HTN, HLD, Type II DM, urinary frequency, history of lyme disease, dysphonia, Phlebitis.   are also affecting patient's functional outcome.   REHAB POTENTIAL: Fair    CLINICAL DECISION MAKING: Evolving/moderate complexity  EVALUATION COMPLEXITY: Moderate  PLAN:  PT FREQUENCY: 2x/week  PT DURATION: 12 weeks  PLANNED INTERVENTIONS: Therapeutic exercises,  Therapeutic activity, Neuromuscular re-education, Balance training, Gait training, Patient/Family education, Self Care, Joint mobilization, Stair training, Vestibular training, Canalith repositioning, Visual/preceptual remediation/compensation, DME instructions, Dry Needling, Cognitive remediation, Wheelchair mobility training, Spinal manipulation, Spinal mobilization, Cryotherapy, Moist heat, Splintting, Taping, Vasopneumatic device, Traction, Biofeedback, Manual  therapy, and Re-evaluation  PLAN FOR NEXT SESSION: progress dynamic stability, foot clearance & stride length, ramp navigation, ambulation on different surfaces    Randon Goldsmith, Student-PT  This entire session was performed under direct supervision and direction of a licensed therapist/therapist assistant . I have personally read, edited and approve of the note as written.  Precious Bard, PT 04/22/2023, 5:15 PM

## 2023-04-26 NOTE — Therapy (Signed)
OUTPATIENT PHYSICAL THERAPY NEURO TREATMENT    Patient Name: Karen Frank MRN: [id number] DOB:23-Nov-1945, 77 y.o., female Today's Date: 04/26/2023   PCP: Wilford Corner PA REFERRING PROVIDER: Wilford Corner, PA  END OF SESSION:            No past medical history on file. No past surgical history on file. There are no problems to display for this patient.   ONSET DATE: February 2024  REFERRING DIAG: imbalance  THERAPY DIAG:  No diagnosis found.  Rationale for Evaluation and Treatment: Rehabilitation  SUBJECTIVE:                                                                                                                                                                                             SUBJECTIVE STATEMENT: Patient reports no falls or changes to medications since her last visit. She states she has been doing some walking around the house without an assistive device or furniture surfing.   Pt accompanied by: self  PERTINENT HISTORY: Patient is returning back to PT for balance deficits. She has been seen in this clinic in the past, was discharged due to no shows. Patient had a fall in November 2023. Uses a cane for ambulation of short distance.   PAIN:  Are you having pain? No  PRECAUTIONS: Fall  WEIGHT BEARING RESTRICTIONS: No  FALLS: Has patient fallen in last 6 months? Yes. Number of falls 1  LIVING ENVIRONMENT: Lives with: lives alone Lives in: House/apartment Stairs: No Has following equipment at home: Single point cane, Treyana Sturgell - 2 wheeled, and shower chair  PLOF: Independent with basic ADLs  PATIENT GOALS: walk again, be able to go for long walks again  OBJECTIVE:   DIAGNOSTIC FINDINGS:  IMPRESSION: No acute intracranial abnormality.   IMPRESSION: (06/22/21) 1. Ventriculomegaly, slightly progressed from 06/15/2014. This does not appear significantly disproportionate to the cerebral sulci. This however does not  exclude the diagnosis of normal pressure hydrocephalus in the appropriate context. 2. Moderate findings of follow-up chronic small vessel disease as further detailed above. 3. Findings consistent with sequelae of remote microhemorrhage in the left posterior frontal lobe.   CT Lumbar spine without contrast 05/07/2021: 1. No acute fracture or listhesis. There is chronic-appearing anterolisthesis of L4 on L5. 2. 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    COGNITION: Overall cognitive status: Within functional limits for tasks assessed   SENSATION: WFL  COORDINATION: Heel shin test: WFL Nose finger: decreased L; WFL    POSTURE: rounded shoulders and forward head    LOWER EXTREMITY MMT:    MMT Right Eval Left Eval  Hip flexion  4 4+  Hip extension    Hip abduction 3+ 3+  Hip adduction 3 4-  Hip internal rotation    Hip external rotation    Knee flexion 3+ 3+  Knee extension 4- 4  Ankle dorsiflexion 4- 4-  Ankle plantarflexion 4- 4-  Ankle inversion    Ankle eversion    (Blank rows = not tested)  BED MOBILITY:  Patient reports independent  TRANSFERS: Assistive device utilized:  hurrycane   Sit to stand: CGA Stand to sit: CGA Chair to chair: CGA   RAMP:  *address in future session  CURB:  *address in future session    GAIT: Gait pattern: decreased arm swing- Right, decreased arm swing- Left, decreased step length- Right, decreased step length- Left, decreased stride length, decreased hip/knee flexion- Right, decreased hip/knee flexion- Left, poor foot clearance- Right, and poor foot clearance- Left Distance walked: ~50' w/ CGA and Hurry [employer] device utilized:  Hurry cane Level of assistance: CGA Comments: Pt held [employer] in [employer], PT educated pt on proper technique for increased safety, and advised pt to switch cane to L side.    FUNCTIONAL TESTS:  5 times sit to stand: 11.1 sec 6 minute walk test: 255' in 2\' 51"  w/ Hurry Cane in L hand and  CGA 10 meter walk test: 0.64 w/ Hurry Cane in R hand and CGA (see above)   Berg Balance Scale: 32/56  PATIENT SURVEYS:  FOTO 44, predicted d/c 52  TODAY'S TREATMENT:                                                                                                                              DATE: 04/26/23  Unless otherwise stated, CGA was provided and gait belt donned in order to ensure pt safety.   *** TherEx: Seated hamstring curls w/ RTB 2x10 each LE Seated marches 2.5# aw 2x20  Long arc quad 2.5# aw 2x20  Seated lateral hurdle step overs 1x15 each LE 3x5 STS, no UE Walked lap with 2.5 #aw x4, rated two laps a medium  Neuro RE-ed:   Standing with CGA next to support surface:  Airex pad: static stand 30 seconds, noticeable trembling of ankles/LE's with fatigue and challenge to maintain stability Airex pad: static stand eyes closed 30 seconds, noticeable trembling of ankles/LE's with fatigue and challenge to maintain stability Airex pad: horizontal head turns scanning room 30 seconds; Airex pad: vertical head turns 30s Airex pad: Split stance with 6 inch step, x30 seconds, two trials one with each LE in front, minimal swaying, easier with left foot in front     PATIENT EDUCATION: Education details: goals, POC, safety considerations Person educated: Patient Education method: Explanation, Demonstration, Tactile cues, and Verbal cues Education comprehension: verbalized understanding, returned demonstration, verbal cues required, tactile cues required, and needs further education  HOME EXERCISE PROGRAM: Access Code: [email or web address] URL: [email or web address] Date: 11/26/2022 Prepared by: Precious Bard Exercises - Seated March  -  1 x daily - 7 x weekly - 2 sets - 10 reps - 5 hold   GOALS: Goals reviewed with patient? Yes  SHORT TERM GOALS: Target date:03/06/2023     Patient will be independent in home exercise program to improve strength/mobility for better functional  independence with ADLs. Baseline: Given 6/10 8/21: intermittent compliance Goal status: Partially Met    LONG TERM GOALS: Target date: 05/01/2023    Patient will increase FOTO score to equal to or greater than 52 %   to demonstrate statistically significant improvement in mobility and quality of life.  Baseline: 5/29: 49% 8/21: 42% 10/21: 63%  Goal status: MET  2.  Patient will increase Berg Balance score by > 6 points to demonstrate decreased fall risk during functional activities Baseline: 5/29: 32/56 8/21: 34/56 10/21: 37/56  Goal status: IN PROGRESS  3.  Patient will increase 10 meter walk test to >1.81m/s as to improve gait speed for better community ambulation and to reduce fall risk. Baseline: 5/29: 0.64 w/ Hurry Cane in R hand and CGA 8/21: 0.67 m/s with Nakira Litzau 10/21: 1.0 m/s with Manessa Buley  Goal status: MET  4.  Patient will increase six minute walk test distance to >1000 for progression to community ambulator and improve gait ability Baseline: 255' in 2\' 51"  w/ Hurry Cane in L hand and CGA 8/21: 605 ft with RW 10/21: 808 ft with RW  Goal status: IN PROGRESS    ASSESSMENT:  CLINICAL IMPRESSION: *** Patient presents to therapy motivated and continuing to make progress towards her goals. Today's treatment session focused on LE strengthening, endurance, and balance. She ambulates with poor foot clearance and as fatigue increases the amount of foot clearance decreases. She was able to ambulate greater than four hundred feet with ankle weights demonstrating an improvement in endurance. She still presents with impaired balance evidenced by trembling of LE's with Airex activities. Patient will benefit from skilled physical therapy to increase strength, stability, and increase functional capacity of mobility for improved quality of life.  OBJECTIVE IMPAIRMENTS: Abnormal gait, decreased activity tolerance, decreased balance, decreased endurance, decreased knowledge of use of DME,  decreased mobility, difficulty walking, decreased strength, improper body mechanics, postural dysfunction, and obesity.   ACTIVITY LIMITATIONS: carrying, bending, standing, squatting, stairs, transfers, reach over head, locomotion level, and caring for others  PARTICIPATION LIMITATIONS: meal prep, cleaning, laundry, shopping, community activity, and yard work  PERSONAL FACTORS: Age, Behavior pattern, Fitness, Past/current experiences, Profession, Sex, Time since onset of injury/illness/exacerbation, and 3+ comorbidities: HTN, HLD, Type II DM, urinary frequency, history of lyme disease, dysphonia, Phlebitis.   are also affecting patient's functional outcome.   REHAB POTENTIAL: Fair    CLINICAL DECISION MAKING: Evolving/moderate complexity  EVALUATION COMPLEXITY: Moderate  PLAN:  PT FREQUENCY: 2x/week  PT DURATION: 12 weeks  PLANNED INTERVENTIONS: Therapeutic exercises, Therapeutic activity, Neuromuscular re-education, Balance training, Gait training, Patient/Family education, Self Care, Joint mobilization, Stair training, Vestibular training, Canalith repositioning, Visual/preceptual remediation/compensation, DME instructions, Dry Needling, Cognitive remediation, Wheelchair mobility training, Spinal manipulation, Spinal mobilization, Cryotherapy, Moist heat, Splintting, Taping, Vasopneumatic device, Traction, Biofeedback, Manual therapy, and Re-evaluation  PLAN FOR NEXT SESSION: progress dynamic stability, foot clearance & stride length, ramp navigation, ambulation on different surfaces    Randon Goldsmith, Student-PT  This entire session was performed under direct supervision and direction of a licensed Estate agent . I have personally read, edited and approve of the note as written.  Randon Goldsmith, Student-PT 04/26/2023, 9:58 AM

## 2023-04-29 ENCOUNTER — Ambulatory Visit: Payer: Medicare Other

## 2023-04-29 DIAGNOSIS — M6281 Muscle weakness (generalized): Secondary | ICD-10-CM

## 2023-04-29 DIAGNOSIS — R2681 Unsteadiness on feet: Secondary | ICD-10-CM

## 2023-04-29 DIAGNOSIS — R2689 Other abnormalities of gait and mobility: Secondary | ICD-10-CM

## 2023-04-29 DIAGNOSIS — R262 Difficulty in walking, not elsewhere classified: Secondary | ICD-10-CM

## 2023-05-06 ENCOUNTER — Ambulatory Visit: Payer: Medicare Other

## 2023-05-13 ENCOUNTER — Ambulatory Visit: Payer: Medicare Other

## 2023-05-20 ENCOUNTER — Ambulatory Visit: Payer: Medicare Other

## 2023-05-27 ENCOUNTER — Ambulatory Visit: Payer: Medicare Other | Attending: Family Medicine

## 2023-05-27 DIAGNOSIS — R2689 Other abnormalities of gait and mobility: Secondary | ICD-10-CM | POA: Diagnosis present

## 2023-05-27 DIAGNOSIS — M6281 Muscle weakness (generalized): Secondary | ICD-10-CM | POA: Insufficient documentation

## 2023-05-27 DIAGNOSIS — R262 Difficulty in walking, not elsewhere classified: Secondary | ICD-10-CM | POA: Insufficient documentation

## 2023-05-27 DIAGNOSIS — R2681 Unsteadiness on feet: Secondary | ICD-10-CM | POA: Diagnosis present

## 2023-05-27 NOTE — Therapy (Signed)
OUTPATIENT PHYSICAL THERAPY NEURO TREATMENT /Recert   Patient Name: Karen Frank MRN: [id number] DOB:[birth year], 77 y.o., female Today's Date: 05/27/2023   PCP: Wilford Corner PA REFERRING PROVIDER: Wilford Corner, PA  END OF SESSION:  PT End of Session - 05/27/23 1534     Visit Number 13    Number of Visits 41    Date for PT Re-Evaluation 08/19/23    Authorization Type 10/10 eval 11/14/22    PT Start Time 1452    PT Stop Time 1529    PT Time Calculation (min) 37 min    Equipment Utilized During Treatment Gait belt    Activity Tolerance Patient tolerated treatment well    Behavior During Therapy WFL for tasks assessed/performed                       History reviewed. No pertinent past medical history. History reviewed. No pertinent surgical history. There are no problems to display for this patient.   ONSET DATE: February 2024  REFERRING DIAG: imbalance  THERAPY DIAG:  Unsteadiness on feet  Difficulty in walking, not elsewhere classified  Muscle weakness (generalized)  Impairment of balance  Other abnormalities of gait and mobility  Rationale for Evaluation and Treatment: Rehabilitation  SUBJECTIVE:                                                                                                                                                                                             SUBJECTIVE STATEMENT: Patient reports no stumbles/ falls or changes to medications since her last visit. Pt reports starting yesterday her walking has declined and she started back using her Jess Toney in the house. She arrived around seven minutes late to today's session.   Pt accompanied by: self  PERTINENT HISTORY: Patient is returning back to PT for balance deficits. She has been seen in this clinic in the past, was discharged due to no shows. Patient had a fall in November 2023. Uses a cane for ambulation of short distance.   PAIN:  Are you having  pain? No  PRECAUTIONS: Fall  WEIGHT BEARING RESTRICTIONS: No  FALLS: Has patient fallen in last 6 months? Yes. Number of falls 1  LIVING ENVIRONMENT: Lives with: lives alone Lives in: House/apartment Stairs: No Has following equipment at home: Single point cane, Hurshell Dino - 2 wheeled, and shower chair  PLOF: Independent with basic ADLs  PATIENT GOALS: walk again, be able to go for long walks again  OBJECTIVE:   DIAGNOSTIC FINDINGS:  IMPRESSION: No acute intracranial abnormality.   IMPRESSION: (06/22/21) 1. Ventriculomegaly, slightly progressed from 06/15/2014. This does  not appear significantly disproportionate to the cerebral sulci. This however does not exclude the diagnosis of normal pressure hydrocephalus in the appropriate context. 2. Moderate findings of follow-up chronic small vessel disease as further detailed above. 3. Findings consistent with sequelae of remote microhemorrhage in the left posterior frontal lobe.   CT Lumbar spine without contrast 05/07/2021: 1. No acute fracture or listhesis. There is chronic-appearing anterolisthesis of L4 on L5. 2. 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    COGNITION: Overall cognitive status: Within functional limits for tasks assessed   SENSATION: WFL  COORDINATION: Heel shin test: WFL Nose finger: decreased L; WFL    POSTURE: rounded shoulders and forward head    LOWER EXTREMITY MMT:    MMT Right Eval Left Eval  Hip flexion 4 4+  Hip extension    Hip abduction 3+ 3+  Hip adduction 3 4-  Hip internal rotation    Hip external rotation    Knee flexion 3+ 3+  Knee extension 4- 4  Ankle dorsiflexion 4- 4-  Ankle plantarflexion 4- 4-  Ankle inversion    Ankle eversion    (Blank rows = not tested)  BED MOBILITY:  Patient reports independent  TRANSFERS: Assistive device utilized:  hurrycane   Sit to stand: CGA Stand to sit: CGA Chair to chair: CGA   RAMP:  *address in future session  CURB:   *address in future session    GAIT: Gait pattern: decreased arm swing- Right, decreased arm swing- Left, decreased step length- Right, decreased step length- Left, decreased stride length, decreased hip/knee flexion- Right, decreased hip/knee flexion- Left, poor foot clearance- Right, and poor foot clearance- Left Distance walked: ~50' w/ CGA and Hurry [employer] device utilized:  Hurry cane Level of assistance: CGA Comments: Pt held [employer] in [employer], PT educated pt on proper technique for increased safety, and advised pt to switch cane to L side.    FUNCTIONAL TESTS:  5 times sit to stand: 11.1 sec 6 minute walk test: 255' in 2\' 51"  w/ Hurry Cane in L hand and CGA 10 meter walk test: 0.64 w/ Hurry Cane in R hand and CGA (see above)   Berg Balance Scale: 32/56  PATIENT SURVEYS:  FOTO 44, predicted d/c 52  TODAY'S TREATMENT:                                                                                                                              DATE: 05/27/23  Unless otherwise stated, CGA was provided and gait belt donned in order to ensure pt safety.   Goals reassessed in today's session for recert.  Berg : 43/56 : 675 ft with RW, RPE 5/10  PATIENT EDUCATION: Education details: goals, POC, safety considerations Person educated: Patient Education method: Explanation, Demonstration, Tactile cues, and Verbal cues Education comprehension: verbalized understanding, returned demonstration, verbal cues required, tactile cues required, and needs further education  HOME EXERCISE PROGRAM: Access Code: [email or web address] URL: [email or web address]  Date: 11/26/2022 Prepared by: Precious Bard Exercises - Seated March  - 1 x daily - 7 x weekly - 2 sets - 10 reps - 5 hold   GOALS: Goals reviewed with patient? Yes  SHORT TERM GOALS: Target date:03/06/2023     Patient will be independent in home exercise program to improve strength/mobility for better functional  independence with ADLs. Baseline: Given 6/10 8/21: intermittent compliance  Goal status: Partially Met    LONG TERM GOALS: Target date: 05/01/2023    Patient will increase FOTO score to equal to or greater than 52 %   to demonstrate statistically significant improvement in mobility and quality of life.  Baseline: 5/29: 49% 8/21: 42% 10/21: 63%  Goal status: MET  2.  Patient will increase Berg Balance score to >45  to demonstrate decreased fall risk during functional activities Baseline: 5/29: 32/56 8/21: 34/56 10/21: 37/56 12/9: 43/56 Goal status: MET, REVISED  3.  Patient will increase 10 meter walk test to >1.68m/s as to improve gait speed for better community ambulation and to reduce fall risk. Baseline: 5/29: 0.64 w/ Hurry Cane in R hand and CGA 8/21: 0.67 m/s with Dylen Mcelhannon 10/21: 1.0 m/s with Karlos Scadden  Goal status: MET  4.  Patient will increase six minute walk test distance to >1000 for progression to community ambulator and improve gait ability Baseline: 255' in 2\' 51"  w/ Hurry Cane in L hand and CGA 8/21: 605 ft with RW 10/21: 808 ft with RW 12/9: 675 ft with RW Goal status: IN PROGRESS  5. Patient will tolerate 10 seconds of single leg stance without assistance or loss of balance to reduce risk of falls.   Baseline: only able to perform with UE support   Goal status: NEW  6. Patient will ambulate >250 ft without use of AD demonstrating improved gait ability for home ambulation.   Baseline: pt requires use of RW Goal Status: NEW   ASSESSMENT:  CLINICAL IMPRESSION:  Patient presents to therapy after almost a month since previous visit due to personal circumstances and scheduling conflicts. She presents with a decline in function most noted by pt report of needing rolling Jeffren Dombek for household ambulation which at last visit she was reporting discontinued use for home. She decreased her distance for the demonstrating a decline in gait ability and endurance. She met her goal  for the [facility] assessment (43/56) indicating improvements in balance, but still at a high risk for falls. She is most challenged by activities involving single leg stance and narrowing base of support, which she requires UE assist to maintain stability. Patient will benefit from skilled physical therapy to increase strength, stability, and increase functional capacity of mobility for improved quality of life.  OBJECTIVE IMPAIRMENTS: Abnormal gait, decreased activity tolerance, decreased balance, decreased endurance, decreased knowledge of use of DME, decreased mobility, difficulty walking, decreased strength, improper body mechanics, postural dysfunction, and obesity.   ACTIVITY LIMITATIONS: carrying, bending, standing, squatting, stairs, transfers, reach over head, locomotion level, and caring for others  PARTICIPATION LIMITATIONS: meal prep, cleaning, laundry, shopping, community activity, and yard work  PERSONAL FACTORS: Age, Behavior pattern, Fitness, Past/current experiences, Profession, Sex, Time since onset of injury/illness/exacerbation, and 3+ comorbidities: HTN, HLD, Type II DM, urinary frequency, history of lyme disease, dysphonia, Phlebitis.   are also affecting patient's functional outcome.   REHAB POTENTIAL: Fair    CLINICAL DECISION MAKING: Evolving/moderate complexity  EVALUATION COMPLEXITY: Moderate  PLAN:  PT FREQUENCY: 2x/week  PT DURATION: 12 weeks  PLANNED  INTERVENTIONS: Therapeutic exercises, Therapeutic activity, Neuromuscular re-education, Balance training, Gait training, Patient/Family education, Self Care, Joint mobilization, Stair training, Vestibular training, Canalith repositioning, Visual/preceptual remediation/compensation, DME instructions, Dry Needling, Cognitive remediation, Wheelchair mobility training, Spinal manipulation, Spinal mobilization, Cryotherapy, Moist heat, Splintting, Taping, Vasopneumatic device, Traction, Biofeedback, Manual therapy, and  Re-evaluation  PLAN FOR NEXT SESSION: progress dynamic stability, foot clearance & stride length, ramp navigation, ambulation on different surfaces, ambulation in // bars without assist    Randon Goldsmith, Student-PT  This entire session was performed under direct supervision and direction of a licensed therapist/therapist assistant . I have personally read, edited and approve of the note as written.  Maylon Peppers, PT, DPT Physical Therapist - [facility]  05/27/2023, 4:06 PM

## 2023-06-03 ENCOUNTER — Ambulatory Visit: Payer: Medicare Other

## 2023-06-03 DIAGNOSIS — R2689 Other abnormalities of gait and mobility: Secondary | ICD-10-CM

## 2023-06-03 DIAGNOSIS — R2681 Unsteadiness on feet: Secondary | ICD-10-CM | POA: Diagnosis not present

## 2023-06-03 DIAGNOSIS — R262 Difficulty in walking, not elsewhere classified: Secondary | ICD-10-CM

## 2023-06-03 NOTE — Therapy (Signed)
OUTPATIENT PHYSICAL THERAPY NEURO TREATMENT  Patient Name: Karen Frank MRN: [id number] DOB:August 11, 1945, 77 y.o., female Today's Date: 06/03/2023   PCP: Wilford Corner PA REFERRING PROVIDER: Wilford Corner, PA  END OF SESSION:  PT End of Session - 06/03/23 1534     Visit Number 14    Number of Visits 41    Date for PT Re-Evaluation 08/19/23    Authorization Type 10/10 eval 11/14/22    PT Start Time 1459    PT Stop Time 1529    PT Time Calculation (min) 30 min    Equipment Utilized During Treatment Gait belt    Activity Tolerance Patient tolerated treatment well    Behavior During Therapy [facility] for tasks assessed/performed                       No past medical history on file. No past surgical history on file. There are no active problems to display for this patient.   ONSET DATE: February 2024  REFERRING DIAG: imbalance  THERAPY DIAG:  Difficulty in walking, not elsewhere classified  Unsteadiness on feet  Other abnormalities of gait and mobility  Impairment of balance  Rationale for Evaluation and Treatment: Rehabilitation  SUBJECTIVE:                                                                                                                                                                                             SUBJECTIVE STATEMENT: Patient reports no stumbles/ falls or changes to medications since her last visit. She arrived around 14 minutes late to today's session due to car trouble.   Pt accompanied by: self  PERTINENT HISTORY: Patient is returning back to PT for balance deficits. She has been seen in this clinic in the past, was discharged due to no shows. Patient had a fall in November 2023. Uses a cane for ambulation of short distance.   PAIN:  Are you having pain? No  PRECAUTIONS: Fall  WEIGHT BEARING RESTRICTIONS: No  FALLS: Has patient fallen in last 6 months? Yes. Number of falls 1  LIVING  ENVIRONMENT: Lives with: lives alone Lives in: House/apartment Stairs: No Has following equipment at home: Single point cane, Cherae Marton - 2 wheeled, and shower chair  PLOF: Independent with basic ADLs  PATIENT GOALS: walk again, be able to go for long walks again  OBJECTIVE:   DIAGNOSTIC FINDINGS:  IMPRESSION: No acute intracranial abnormality.   IMPRESSION: (06/22/21) 1. Ventriculomegaly, slightly progressed from 06/15/2014. This does not appear significantly disproportionate to the cerebral sulci. This however does not exclude the diagnosis of normal pressure hydrocephalus in  the appropriate context. 2. Moderate findings of follow-up chronic small vessel disease as further detailed above. 3. Findings consistent with sequelae of remote microhemorrhage in the left posterior frontal lobe.   CT Lumbar spine without contrast 05/07/2021: 1. No acute fracture or listhesis. There is chronic-appearing anterolisthesis of L4 on L5. 2. 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    COGNITION: Overall cognitive status: Within functional limits for tasks assessed   SENSATION: WFL  COORDINATION: Heel shin test: WFL Nose finger: decreased L; WFL    POSTURE: rounded shoulders and forward head    LOWER EXTREMITY MMT:    MMT Right Eval Left Eval  Hip flexion 4 4+  Hip extension    Hip abduction 3+ 3+  Hip adduction 3 4-  Hip internal rotation    Hip external rotation    Knee flexion 3+ 3+  Knee extension 4- 4  Ankle dorsiflexion 4- 4-  Ankle plantarflexion 4- 4-  Ankle inversion    Ankle eversion    (Blank rows = not tested)  BED MOBILITY:  Patient reports independent  TRANSFERS: Assistive device utilized:  hurrycane   Sit to stand: CGA Stand to sit: CGA Chair to chair: CGA   RAMP:  *address in future session  CURB:  *address in future session    GAIT: Gait pattern: decreased arm swing- Right, decreased arm swing- Left, decreased step length- Right, decreased  step length- Left, decreased stride length, decreased hip/knee flexion- Right, decreased hip/knee flexion- Left, poor foot clearance- Right, and poor foot clearance- Left Distance walked: ~50' w/ CGA and Hurry [employer] device utilized:  Hurry cane Level of assistance: CGA Comments: Pt held [employer] in [employer], PT educated pt on proper technique for increased safety, and advised pt to switch cane to L side.    FUNCTIONAL TESTS:  5 times sit to stand: 11.1 sec 6 minute walk test: 255' in 2\' 51"  w/ Hurry Cane in L hand and CGA 10 meter walk test: 0.64 w/ Hurry Cane in R hand and CGA (see above)   Berg Balance Scale: 32/56  PATIENT SURVEYS:  FOTO 44, predicted d/c 52  TODAY'S TREATMENT:                                                                                                                              DATE: 06/03/23  Unless otherwise stated, CGA was provided and gait belt donned in order to ensure pt safety.   Therex:  -STS 2x10, first set with UE on thigh second set without use of UE's -Forward step ups 1x10 each LE, pt requires rest break in between each set, no UE -Standing hamstring curls with 3#aw 1x10 each LE -Seated foam roller step overs with 3#aw 1x10 each LE  NMR:  -Blaze pod activity- Pods set to random and arranged in a horizontal line in front of patient. If the pod lights up red, patient must tap it with her right lower  extremity. If pod lights up green, patient must tap it with her left lower extremity. Activity focusing on single leg balance, righting reactions, and adding in a cognitive challenge. Patient performed four rounds with ten hits each. First two rounds performed with UE support, last two performed without.  -Ambulation >300 ft  with 3#aw from clinic to upstairs lobby, no LOB observed    PATIENT EDUCATION: Education details: goals, POC, safety considerations Person educated: Patient Education method: Explanation, Demonstration, Tactile cues,  and Verbal cues Education comprehension: verbalized understanding, returned demonstration, verbal cues required, tactile cues required, and needs further education  HOME EXERCISE PROGRAM: Access Code: [email or web address] URL: [email or web address] Date: 11/26/2022 Prepared by: Precious Bard Exercises - Seated March  - 1 x daily - 7 x weekly - 2 sets - 10 reps - 5 hold   GOALS: Goals reviewed with patient? Yes  SHORT TERM GOALS: Target date:03/06/2023     Patient will be independent in home exercise program to improve strength/mobility for better functional independence with ADLs. Baseline: Given 6/10 8/21: intermittent compliance  Goal status: Partially Met    LONG TERM GOALS: Target date: 05/01/2023    Patient will increase FOTO score to equal to or greater than 52 %   to demonstrate statistically significant improvement in mobility and quality of life.  Baseline: 5/29: 49% 8/21: 42% 10/21: 63%  Goal status: MET  2.  Patient will increase Berg Balance score to >45  to demonstrate decreased fall risk during functional activities Baseline: 5/29: 32/56 8/21: 34/56 10/21: 37/56 12/9: 43/56 Goal status: MET, REVISED  3.  Patient will increase 10 meter walk test to >1.45m/s as to improve gait speed for better community ambulation and to reduce fall risk. Baseline: 5/29: 0.64 w/ Hurry Cane in R hand and CGA 8/21: 0.67 m/s with Jordan Caraveo 10/21: 1.0 m/s with Rayn Shorb  Goal status: MET  4.  Patient will increase six minute walk test distance to >1000 for progression to community ambulator and improve gait ability Baseline: 255' in 2\' 51"  w/ Hurry Cane in L hand and CGA 8/21: 605 ft with RW 10/21: 808 ft with RW 12/9: 675 ft with RW Goal status: IN PROGRESS  5. Patient will tolerate 10 seconds of single leg stance without assistance or loss of balance to reduce risk of falls.   Baseline: only able to perform with UE support   Goal status: NEW  6. Patient will ambulate >250 ft without use  of AD demonstrating improved gait ability for home ambulation.   Baseline: pt requires use of RW Goal Status: NEW   ASSESSMENT:  CLINICAL IMPRESSION:  Pt presents motivated with session limited by late arrival. She was able to progress her LE strengthening evidenced by performance of step ups without use of UE. She still reports fatigue throughout session requiring seated rest breaks. She is challenged by single leg balance activities, but with increased repetitions she was able to perform foot taps without UE support. At end of session, pt was able to progress functional mobility by ambulating from clinic to the upstairs lobby with resistance. Patient will benefit from skilled physical therapy to increase strength, stability, and increase functional capacity of mobility for improved quality of life.  OBJECTIVE IMPAIRMENTS: Abnormal gait, decreased activity tolerance, decreased balance, decreased endurance, decreased knowledge of use of DME, decreased mobility, difficulty walking, decreased strength, improper body mechanics, postural dysfunction, and obesity.   ACTIVITY LIMITATIONS: carrying, bending, standing, squatting, stairs, transfers, reach over head, locomotion level, and caring for others  PARTICIPATION LIMITATIONS: meal prep, cleaning, laundry, shopping, community activity, and yard work  PERSONAL FACTORS: Age, Behavior pattern, Fitness, Past/current experiences, Profession, Sex, Time since onset of injury/illness/exacerbation, and 3+ comorbidities: HTN, HLD, Type II DM, urinary frequency, history of lyme disease, dysphonia, Phlebitis.   are also affecting patient's functional outcome.   REHAB POTENTIAL: Fair    CLINICAL DECISION MAKING: Evolving/moderate complexity  EVALUATION COMPLEXITY: Moderate  PLAN:  PT FREQUENCY: 2x/week  PT DURATION: 12 weeks  PLANNED INTERVENTIONS: Therapeutic exercises, Therapeutic activity, Neuromuscular re-education, Balance training, Gait training,  Patient/Family education, Self Care, Joint mobilization, Stair training, Vestibular training, Canalith repositioning, Visual/preceptual remediation/compensation, DME instructions, Dry Needling, Cognitive remediation, Wheelchair mobility training, Spinal manipulation, Spinal mobilization, Cryotherapy, Moist heat, Splintting, Taping, Vasopneumatic device, Traction, Biofeedback, Manual therapy, and Re-evaluation  PLAN FOR NEXT SESSION: progress dynamic stability, foot clearance & stride length, ramp navigation, ambulation on different surfaces, ambulation in // bars without assist    Randon Goldsmith, Student-PT  This entire session was performed under direct supervision and direction of a licensed therapist/therapist assistant . I have personally read, edited and approve of the note as written.  Precious Bard, PT, DPT Physical Therapist - [facility]  Outpatient Physical Therapy- Main Campus [phone]    06/03/2023, 3:36 PM

## 2023-06-10 ENCOUNTER — Ambulatory Visit: Payer: Medicare Other

## 2023-06-13 NOTE — Therapy (Signed)
OUTPATIENT PHYSICAL THERAPY NEURO TREATMENT  Patient Name: Karen Frank MRN: [id number] DOB:1946/04/28, 77 y.o., female Today's Date: 06/17/2023   PCP: Wilford Corner PA REFERRING PROVIDER: Wilford Corner, PA  END OF SESSION:  PT End of Session - 06/17/23 1448     Visit Number 15    Number of Visits 41    Date for PT Re-Evaluation 08/19/23    Authorization Type 10/10 eval 11/14/22    PT Start Time 1448    PT Stop Time 1529    PT Time Calculation (min) 41 min    Equipment Utilized During Treatment Gait belt    Activity Tolerance Patient tolerated treatment well    Behavior During Therapy [facility] for tasks assessed/performed                        History reviewed. No pertinent past medical history. History reviewed. No pertinent surgical history. There are no active problems to display for this patient.   ONSET DATE: February 2024  REFERRING DIAG: imbalance  THERAPY DIAG:  Difficulty in walking, not elsewhere classified  Unsteadiness on feet  Other abnormalities of gait and mobility  Impairment of balance  Rationale for Evaluation and Treatment: Rehabilitation  SUBJECTIVE:                                                                                                                                                                                             SUBJECTIVE STATEMENT: Patient reports her whole body is aching. Is having issues with walking lately. Patient missed last week due to car troubles.   Pt accompanied by: self  PERTINENT HISTORY: Patient is returning back to PT for balance deficits. She has been seen in this clinic in the past, was discharged due to no shows. Patient had a fall in November 2023. Uses a cane for ambulation of short distance.   PAIN:  Are you having pain? No  PRECAUTIONS: Fall  WEIGHT BEARING RESTRICTIONS: No  FALLS: Has patient fallen in last 6 months? Yes. Number of falls 1  LIVING  ENVIRONMENT: Lives with: lives alone Lives in: House/apartment Stairs: No Has following equipment at home: Single point cane, Walker - 2 wheeled, and shower chair  PLOF: Independent with basic ADLs  PATIENT GOALS: walk again, be able to go for long walks again  OBJECTIVE:   DIAGNOSTIC FINDINGS:  IMPRESSION: No acute intracranial abnormality.   IMPRESSION: (06/22/21) 1. Ventriculomegaly, slightly progressed from 06/15/2014. This does not appear significantly disproportionate to the cerebral sulci. This however does not exclude the diagnosis of normal pressure hydrocephalus in the appropriate context.  2. Moderate findings of follow-up chronic small vessel disease as further detailed above. 3. Findings consistent with sequelae of remote microhemorrhage in the left posterior frontal lobe.   CT Lumbar spine without contrast 05/07/2021: 1. No acute fracture or listhesis. There is chronic-appearing anterolisthesis of L4 on L5. 2. 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    COGNITION: Overall cognitive status: Within functional limits for tasks assessed   SENSATION: WFL  COORDINATION: Heel shin test: WFL Nose finger: decreased L; WFL    POSTURE: rounded shoulders and forward head    LOWER EXTREMITY MMT:    MMT Right Eval Left Eval  Hip flexion 4 4+  Hip extension    Hip abduction 3+ 3+  Hip adduction 3 4-  Hip internal rotation    Hip external rotation    Knee flexion 3+ 3+  Knee extension 4- 4  Ankle dorsiflexion 4- 4-  Ankle plantarflexion 4- 4-  Ankle inversion    Ankle eversion    (Blank rows = not tested)  BED MOBILITY:  Patient reports independent  TRANSFERS: Assistive device utilized:  hurrycane   Sit to stand: CGA Stand to sit: CGA Chair to chair: CGA   RAMP:  *address in future session  CURB:  *address in future session    GAIT: Gait pattern: decreased arm swing- Right, decreased arm swing- Left, decreased step length- Right, decreased  step length- Left, decreased stride length, decreased hip/knee flexion- Right, decreased hip/knee flexion- Left, poor foot clearance- Right, and poor foot clearance- Left Distance walked: ~50' w/ CGA and Hurry [employer] device utilized:  Hurry cane Level of assistance: CGA Comments: Pt held [employer] in [employer], PT educated pt on proper technique for increased safety, and advised pt to switch cane to L side.    FUNCTIONAL TESTS:  5 times sit to stand: 11.1 sec 6 minute walk test: 255' in 2\' 51"  w/ Hurry Cane in L hand and CGA 10 meter walk test: 0.64 w/ Hurry Cane in R hand and CGA (see above)   Berg Balance Scale: 32/56  PATIENT SURVEYS:  FOTO 44, predicted d/c 52  TODAY'S TREATMENT:                                                                                                                              DATE: 06/17/23  Unless otherwise stated, CGA was provided and gait belt donned in order to ensure pt safety.   Therex:  -ambulate 2 laps around PT gym and OT gym ~350 ft with RW -STS 2x10, first set with UE on thigh second set without use of UE's   3lb ankle weights;  -LAQ 10x 3 second holds -march 3 seconds each LE 10x each side  -heel raises 20x   Adduction ball squeeze: 15x  Adduction ball squeeze with IR/ER toe taps 10x each side   Neuro Re-ed:  Standing with CGA next to support surface:  Airex pad: static stand 30  seconds x 2 trials, noticeable trembling of ankles/LE's with fatigue and challenge to maintain stability Airex pad: static stand eyes closed 30 seconds x 2 trials  Airex pad: horizontal head turns  scanning room 30 seconds ; cueing for arc of motion  Airex pad: vertical head turns 10x cueing for arc of motion, noticeable sway with upward gaze increasing demand on ankle righting reaction musculature Airex pad 6" step tandem stance, 30 seconds each LE position ; 2 sets   PATIENT EDUCATION: Education details: goals, POC, safety considerations Person  educated: Patient Education method: Explanation, Demonstration, Tactile cues, and Verbal cues Education comprehension: verbalized understanding, returned demonstration, verbal cues required, tactile cues required, and needs further education  HOME EXERCISE PROGRAM: Access Code: [email or web address] URL: [email or web address] Date: 11/26/2022 Prepared by: Precious Bard Exercises - Seated March  - 1 x daily - 7 x weekly - 2 sets - 10 reps - 5 hold   GOALS: Goals reviewed with patient? Yes  SHORT TERM GOALS: Target date:03/06/2023     Patient will be independent in home exercise program to improve strength/mobility for better functional independence with ADLs. Baseline: Given 6/10 8/21: intermittent compliance  Goal status: Partially Met    LONG TERM GOALS: Target date: 08/19/23    Patient will increase FOTO score to equal to or greater than 52 %   to demonstrate statistically significant improvement in mobility and quality of life.  Baseline: 5/29: 49% 8/21: 42% 10/21: 63%  Goal status: MET  2.  Patient will increase Berg Balance score to >45  to demonstrate decreased fall risk during functional activities Baseline: 5/29: 32/56 8/21: 34/56 10/21: 37/56 12/9: 43/56 Goal status: MET, REVISED  3.  Patient will increase 10 meter walk test to >1.7m/s as to improve gait speed for better community ambulation and to reduce fall risk. Baseline: 5/29: 0.64 w/ Hurry Cane in R hand and CGA 8/21: 0.67 m/s with walker 10/21: 1.0 m/s with walker  Goal status: MET  4.  Patient will increase six minute walk test distance to >1000 for progression to community ambulator and improve gait ability Baseline: 255' in 2\' 51"  w/ Hurry Cane in L hand and CGA 8/21: 605 ft with RW 10/21: 808 ft with RW 12/9: 675 ft with RW Goal status: IN PROGRESS  5. Patient will tolerate 10 seconds of single leg stance without assistance or loss of balance to reduce risk of falls.   Baseline: only able to perform with UE  support   Goal status: NEW  6. Patient will ambulate >250 ft without use of AD demonstrating improved gait ability for home ambulation.   Baseline: pt requires use of RW Goal Status: NEW   ASSESSMENT:  CLINICAL IMPRESSION:   Patient educated on need to perform seated exercises when she is not walking as much. Patient requires intermittent rest breaks throughout session. She does tolerate ambulation well this session with walker with occasional cueing for encouragement. Ankle righting reactions on unstable surface are challenging but able to be performed. Patient will benefit from skilled physical therapy to increase strength, stability, and increase functional capacity of mobility for improved quality of life.  OBJECTIVE IMPAIRMENTS: Abnormal gait, decreased activity tolerance, decreased balance, decreased endurance, decreased knowledge of use of DME, decreased mobility, difficulty walking, decreased strength, improper body mechanics, postural dysfunction, and obesity.   ACTIVITY LIMITATIONS: carrying, bending, standing, squatting, stairs, transfers, reach over head, locomotion level, and caring for others  PARTICIPATION LIMITATIONS: meal prep, cleaning, laundry, shopping, community activity, and yard work  PERSONAL FACTORS: Age, Behavior pattern, Fitness, Past/current experiences, Profession, Sex, Time since onset of injury/illness/exacerbation, and 3+ comorbidities: HTN, HLD, Type II DM, urinary frequency, history of lyme disease, dysphonia, Phlebitis.   are also affecting patient's functional outcome.   REHAB POTENTIAL: Fair    CLINICAL DECISION MAKING: Evolving/moderate complexity  EVALUATION COMPLEXITY: Moderate  PLAN:  PT FREQUENCY: 2x/week  PT DURATION: 12 weeks  PLANNED INTERVENTIONS: Therapeutic exercises, Therapeutic activity, Neuromuscular re-education, Balance training, Gait training, Patient/Family education, Self Care, Joint mobilization, Stair training, Vestibular  training, Canalith repositioning, Visual/preceptual remediation/compensation, DME instructions, Dry Needling, Cognitive remediation, Wheelchair mobility training, Spinal manipulation, Spinal mobilization, Cryotherapy, Moist heat, Splintting, Taping, Vasopneumatic device, Traction, Biofeedback, Manual therapy, and Re-evaluation  PLAN FOR NEXT SESSION: progress dynamic stability, foot clearance & stride length, ramp navigation, ambulation on different surfaces, ambulation in // bars without assist     Precious Bard, PT, DPT Physical Therapist - [facility]  Outpatient Physical Therapy- Main Campus [phone]    06/17/2023, 3:29 PM

## 2023-06-17 ENCOUNTER — Ambulatory Visit: Payer: Medicare Other

## 2023-06-17 DIAGNOSIS — R262 Difficulty in walking, not elsewhere classified: Secondary | ICD-10-CM

## 2023-06-17 DIAGNOSIS — R2689 Other abnormalities of gait and mobility: Secondary | ICD-10-CM

## 2023-06-17 DIAGNOSIS — R2681 Unsteadiness on feet: Secondary | ICD-10-CM | POA: Diagnosis not present

## 2023-06-24 ENCOUNTER — Ambulatory Visit: Payer: Medicare Other

## 2023-06-27 NOTE — Therapy (Signed)
 OUTPATIENT PHYSICAL THERAPY NEURO TREATMENT  Patient Name: Karen Frank MRN: [id number] DOB:07-Nov-1945, 78 y.o., female Today's Date: 07/01/2023   PCP: Cyrus Selinda Moose PA REFERRING PROVIDER: Cyrus Selinda Moose, PA  END OF SESSION:  PT End of Session - 07/01/23 1443     Visit Number 16    Number of Visits 41    Date for PT Re-Evaluation 08/19/23    Authorization Type 10/10 eval 11/14/22    PT Start Time 1445    PT Stop Time 1529    PT Time Calculation (min) 44 min    Equipment Utilized During Treatment Gait belt    Activity Tolerance Patient tolerated treatment well    Behavior During Therapy [facility] for tasks assessed/performed                         History reviewed. No pertinent past medical history. History reviewed. No pertinent surgical history. There are no active problems to display for this patient.   ONSET DATE: February 2024  REFERRING DIAG: imbalance  THERAPY DIAG:  Difficulty in walking, not elsewhere classified  Unsteadiness on feet  Other abnormalities of gait and mobility  Impairment of balance  Rationale for Evaluation and Treatment: Rehabilitation  SUBJECTIVE:                                                                                                                                                                                             SUBJECTIVE STATEMENT: Patient reports feeling down, reports the walking isn't going as well as she hoped.   Pt accompanied by: self  PERTINENT HISTORY: Patient is returning back to PT for balance deficits. She has been seen in this clinic in the past, was discharged due to no shows. Patient had a fall in November 2023. Uses a cane for ambulation of short distance.   PAIN:  Are you having pain? No  PRECAUTIONS: Fall  WEIGHT BEARING RESTRICTIONS: No  FALLS: Has patient fallen in last 6 months? Yes. Number of falls 1  LIVING ENVIRONMENT: Lives with: lives alone Lives in:  House/apartment Stairs: No Has following equipment at home: Single point cane, Walker - 2 wheeled, and shower chair  PLOF: Independent with basic ADLs  PATIENT GOALS: walk again, be able to go for long walks again  OBJECTIVE:   DIAGNOSTIC FINDINGS:  IMPRESSION: No acute intracranial abnormality.   IMPRESSION: (06/22/21) 1. Ventriculomegaly, slightly progressed from 06/15/2014. This does not appear significantly disproportionate to the cerebral sulci. This however does not exclude the diagnosis of normal pressure hydrocephalus in the appropriate context. 2. Moderate findings of follow-up chronic  small vessel disease as further detailed above. 3. Findings consistent with sequelae of remote microhemorrhage in the left posterior frontal lobe.   CT Lumbar spine without contrast 05/07/2021: 1. No acute fracture or listhesis. There is chronic-appearing anterolisthesis of L4 on L5. 2. 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    COGNITION: Overall cognitive status: Within functional limits for tasks assessed   SENSATION: WFL  COORDINATION: Heel shin test: WFL Nose finger: decreased L; WFL    POSTURE: rounded shoulders and forward head    LOWER EXTREMITY MMT:    MMT Right Eval Left Eval  Hip flexion 4 4+  Hip extension    Hip abduction 3+ 3+  Hip adduction 3 4-  Hip internal rotation    Hip external rotation    Knee flexion 3+ 3+  Knee extension 4- 4  Ankle dorsiflexion 4- 4-  Ankle plantarflexion 4- 4-  Ankle inversion    Ankle eversion    (Blank rows = not tested)  BED MOBILITY:  Patient reports independent  TRANSFERS: Assistive device utilized:  hurrycane   Sit to stand: CGA Stand to sit: CGA Chair to chair: CGA   RAMP:  *address in future session  CURB:  *address in future session    GAIT: Gait pattern: decreased arm swing- Right, decreased arm swing- Left, decreased step length- Right, decreased step length- Left, decreased stride length,  decreased hip/knee flexion- Right, decreased hip/knee flexion- Left, poor foot clearance- Right, and poor foot clearance- Left Distance walked: ~50' w/ CGA and Hurry [employer] device utilized:  Hurry cane Level of assistance: CGA Comments: Pt held [employer] in [employer], PT educated pt on proper technique for increased safety, and advised pt to switch cane to L side.    FUNCTIONAL TESTS:  5 times sit to stand: 11.1 sec 6 minute walk test: 52' in 2' 51 w/ Hurry Cane in L hand and CGA 10 meter walk test: 0.64 w/ Hurry Cane in R hand and CGA (see above)   Berg Balance Scale: 32/56  PATIENT SURVEYS:  FOTO 44, predicted d/c 52  TODAY'S TREATMENT:                                                                                                                              DATE: 07/01/23  Unless otherwise stated, CGA was provided and gait belt donned in order to ensure pt safety.   Therex:  -ambulate 2 laps around PT gym and OT gym ~350 ft with RW; x 2 sets  -STS 2x10, first set with UE on thigh second set without use of UE'  3lb ankle weights;  -LAQ 10x 3 second holds -march 3 seconds each LE 10x each side  -heel raises 20x  -lateral step 10x each side  Adduction ball squeeze: 15x  SLR with adduction squeeze 15x Adduction ball squeeze with IR/ER toe taps 10x each side   Standing marches 12x each LE StandingHeel raises 15x with  UE support   Seated heel toe raises alternating 20x Seated posterior pelvic tilt /anterior tilt 10x   Neuro Re-ed:  Standing with CGA next to support surface:  Airex pad: static stand 30 seconds x 2 trials, noticeable trembling of ankles/LE's with fatigue and challenge to maintain stability Airex pad: static stand eyes closed 30 seconds x 2 trials  Airex pad: horizontal head turns  scanning room 30 seconds ; cueing for arc of motion  Airex pad: vertical head turns 10x cueing for arc of motion, noticeable sway with upward gaze increasing demand on  ankle righting reaction musculature   PATIENT EDUCATION: Education details: goals, POC, safety considerations Person educated: Patient Education method: Explanation, Demonstration, Tactile cues, and Verbal cues Education comprehension: verbalized understanding, returned demonstration, verbal cues required, tactile cues required, and needs further education  HOME EXERCISE PROGRAM: Access Code: [email or web address] URL: [email or web address] Date: 11/26/2022 Prepared by: Marysue Custard Exercises - Seated March  - 1 x daily - 7 x weekly - 2 sets - 10 reps - 5 hold   GOALS: Goals reviewed with patient? Yes  SHORT TERM GOALS: Target date:03/06/2023     Patient will be independent in home exercise program to improve strength/mobility for better functional independence with ADLs. Baseline: Given 6/10 8/21: intermittent compliance  Goal status: Partially Met    LONG TERM GOALS: Target date: 08/19/23    Patient will increase FOTO score to equal to or greater than 52 %   to demonstrate statistically significant improvement in mobility and quality of life.  Baseline: 5/29: 49% 8/21: 42% 10/21: 63%  Goal status: MET  2.  Patient will increase Berg Balance score to >45  to demonstrate decreased fall risk during functional activities Baseline: 5/29: 32/56 8/21: 34/56 10/21: 37/56 12/9: 43/56 Goal status: MET, REVISED  3.  Patient will increase 10 meter walk test to >1.1m/s as to improve gait speed for better community ambulation and to reduce fall risk. Baseline: 5/29: 0.64 w/ Hurry Cane in R hand and CGA 8/21: 0.67 m/s with walker 10/21: 1.0 m/s with walker  Goal status: MET  4.  Patient will increase six minute walk test distance to >1000 for progression to community ambulator and improve gait ability Baseline: 38' in 2' 51 w/ Hurry Cane in L hand and CGA 8/21: 605 ft with RW 10/21: 808 ft with RW 12/9: 675 ft with RW Goal status: IN PROGRESS  5. Patient will tolerate 10 seconds of  single leg stance without assistance or loss of balance to reduce risk of falls.   Baseline: only able to perform with UE support   Goal status: NEW  6. Patient will ambulate >250 ft without use of AD demonstrating improved gait ability for home ambulation.   Baseline: pt requires use of RW Goal Status: NEW   ASSESSMENT:  CLINICAL IMPRESSION:  Patient reports her walking is difficult with her legs feeling fatigued, but she demonstrates functional ambulation while in clinic.  Sit to stands tolerated without fatigue or LOB this session. Unstable surfaces are fatiguing to patient but able to be tolerated with close CGA.  Patient will benefit from skilled physical therapy to increase strength, stability, and increase functional capacity of mobility for improved quality of life.  OBJECTIVE IMPAIRMENTS: Abnormal gait, decreased activity tolerance, decreased balance, decreased endurance, decreased knowledge of use of DME, decreased mobility, difficulty walking, decreased strength, improper body mechanics, postural dysfunction, and obesity.   ACTIVITY LIMITATIONS: carrying, bending, standing, squatting, stairs, transfers, reach over head, locomotion level, and  caring for others  PARTICIPATION LIMITATIONS: meal prep, cleaning, laundry, shopping, community activity, and yard work  PERSONAL FACTORS: Age, Behavior pattern, Fitness, Past/current experiences, Profession, Sex, Time since onset of injury/illness/exacerbation, and 3+ comorbidities: HTN, HLD, Type II DM, urinary frequency, history of lyme disease, dysphonia, Phlebitis.   are also affecting patient's functional outcome.   REHAB POTENTIAL: Fair    CLINICAL DECISION MAKING: Evolving/moderate complexity  EVALUATION COMPLEXITY: Moderate  PLAN:  PT FREQUENCY: 2x/week  PT DURATION: 12 weeks  PLANNED INTERVENTIONS: Therapeutic exercises, Therapeutic activity, Neuromuscular re-education, Balance training, Gait training, Patient/Family  education, Self Care, Joint mobilization, Stair training, Vestibular training, Canalith repositioning, Visual/preceptual remediation/compensation, DME instructions, Dry Needling, Cognitive remediation, Wheelchair mobility training, Spinal manipulation, Spinal mobilization, Cryotherapy, Moist heat, Splintting, Taping, Vasopneumatic device, Traction, Biofeedback, Manual therapy, and Re-evaluation  PLAN FOR NEXT SESSION: progress dynamic stability, foot clearance & stride length, ramp navigation, ambulation on different surfaces, ambulation in // bars without assist     Danel Requena  Leopoldo, PT, DPT Physical Therapist - [facility]  Outpatient Physical Therapy- Main Campus [phone]    07/01/2023, 3:29 PM

## 2023-07-01 ENCOUNTER — Ambulatory Visit: Payer: Medicare Other | Attending: Family Medicine

## 2023-07-01 DIAGNOSIS — R262 Difficulty in walking, not elsewhere classified: Secondary | ICD-10-CM | POA: Diagnosis present

## 2023-07-01 DIAGNOSIS — R2689 Other abnormalities of gait and mobility: Secondary | ICD-10-CM | POA: Diagnosis present

## 2023-07-01 DIAGNOSIS — R2681 Unsteadiness on feet: Secondary | ICD-10-CM | POA: Insufficient documentation

## 2023-07-08 ENCOUNTER — Ambulatory Visit: Payer: Medicare Other

## 2023-07-15 ENCOUNTER — Ambulatory Visit: Payer: Medicare Other

## 2023-07-15 DIAGNOSIS — R262 Difficulty in walking, not elsewhere classified: Secondary | ICD-10-CM | POA: Diagnosis not present

## 2023-07-15 DIAGNOSIS — R2689 Other abnormalities of gait and mobility: Secondary | ICD-10-CM

## 2023-07-15 DIAGNOSIS — R2681 Unsteadiness on feet: Secondary | ICD-10-CM

## 2023-07-15 NOTE — Therapy (Signed)
OUTPATIENT PHYSICAL THERAPY NEURO TREATMENT  Patient Name: Karen Frank MRN: [id number] DOB:1946/05/03, 78 y.o., female Today's Date: 07/15/2023   PCP: Wilford Corner PA REFERRING PROVIDER: Wilford Corner, PA  END OF SESSION:  PT End of Session - 07/15/23 1504     Visit Number 17    Number of Visits 41    Date for PT Re-Evaluation 08/19/23    Authorization Type 10/10 eval 11/14/22    PT Start Time 1502    PT Stop Time 1529    PT Time Calculation (min) 27 min    Equipment Utilized During Treatment Gait belt    Activity Tolerance Patient tolerated treatment well    Behavior During Therapy WFL for tasks assessed/performed                          History reviewed. No pertinent past medical history. History reviewed. No pertinent surgical history. There are no active problems to display for this patient.   ONSET DATE: February 2024  REFERRING DIAG: imbalance  THERAPY DIAG:  Difficulty in walking, not elsewhere classified  Unsteadiness on feet  Other abnormalities of gait and mobility  Rationale for Evaluation and Treatment: Rehabilitation  SUBJECTIVE:                                                                                                                                                                                             SUBJECTIVE STATEMENT:  Patient arrived late limiting session duration.   Pt accompanied by: self  PERTINENT HISTORY: Patient is returning back to PT for balance deficits. She has been seen in this clinic in the past, was discharged due to no shows. Patient had a fall in November 2023. Uses a cane for ambulation of short distance.   PAIN:  Are you having pain? No  PRECAUTIONS: Fall  WEIGHT BEARING RESTRICTIONS: No  FALLS: Has patient fallen in last 6 months? Yes. Number of falls 1  LIVING ENVIRONMENT: Lives with: lives alone Lives in: House/apartment Stairs: No Has following equipment at  home: Single point cane, Walker - 2 wheeled, and shower chair  PLOF: Independent with basic ADLs  PATIENT GOALS: walk again, be able to go for long walks again  OBJECTIVE:   DIAGNOSTIC FINDINGS:  IMPRESSION: No acute intracranial abnormality.   IMPRESSION: (06/22/21) 1. Ventriculomegaly, slightly progressed from 06/15/2014. This does not appear significantly disproportionate to the cerebral sulci. This however does not exclude the diagnosis of normal pressure hydrocephalus in the appropriate context. 2. Moderate findings of follow-up chronic small vessel disease as further detailed above. 3. Findings consistent  with sequelae of remote microhemorrhage in the left posterior frontal lobe.   CT Lumbar spine without contrast 05/07/2021: 1. No acute fracture or listhesis. There is chronic-appearing anterolisthesis of L4 on L5. 2. 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    COGNITION: Overall cognitive status: Within functional limits for tasks assessed   SENSATION: WFL  COORDINATION: Heel shin test: WFL Nose finger: decreased L; WFL    POSTURE: rounded shoulders and forward head    LOWER EXTREMITY MMT:    MMT Right Eval Left Eval  Hip flexion 4 4+  Hip extension    Hip abduction 3+ 3+  Hip adduction 3 4-  Hip internal rotation    Hip external rotation    Knee flexion 3+ 3+  Knee extension 4- 4  Ankle dorsiflexion 4- 4-  Ankle plantarflexion 4- 4-  Ankle inversion    Ankle eversion    (Blank rows = not tested)  BED MOBILITY:  Patient reports independent  TRANSFERS: Assistive device utilized:  hurrycane   Sit to stand: CGA Stand to sit: CGA Chair to chair: CGA   RAMP:  *address in future session  CURB:  *address in future session    GAIT: Gait pattern: decreased arm swing- Right, decreased arm swing- Left, decreased step length- Right, decreased step length- Left, decreased stride length, decreased hip/knee flexion- Right, decreased hip/knee flexion-  Left, poor foot clearance- Right, and poor foot clearance- Left Distance walked: ~50' w/ CGA and Hurry [employer] device utilized:  Hurry cane Level of assistance: CGA Comments: Pt held [employer] in [employer], PT educated pt on proper technique for increased safety, and advised pt to switch cane to L side.    FUNCTIONAL TESTS:  5 times sit to stand: 11.1 sec 6 minute walk test: 255' in 2\' 51"  w/ Hurry Cane in L hand and CGA 10 meter walk test: 0.64 w/ Hurry Cane in R hand and CGA (see above)   Berg Balance Scale: 32/56  PATIENT SURVEYS:  FOTO 44, predicted d/c 52  TODAY'S TREATMENT:                                                                                                                              DATE: 07/15/23  Unless otherwise stated, CGA was provided and gait belt donned in order to ensure pt safety.   Therex:  -ambulate 2 laps with 3lb ankle weights around PT gym and OT gym ~350 ft with RW; x 2 sets  -STS 2x10, first set with UE on thigh second set without use of UE'  3lb ankle weights;  -LAQ 10x 3 second holds -march 3 seconds each LE 10x each side  -lateral step 10x each side -lateral stepping at bar standing x 4 each direction   Standing 3lb AW  marches 12x each LE Standing 3lb AW Heel raises 15x with UE support    PATIENT EDUCATION: Education details: goals, POC, safety considerations Person educated: Patient  Education method: Explanation, Demonstration, Tactile cues, and Verbal cues Education comprehension: verbalized understanding, returned demonstration, verbal cues required, tactile cues required, and needs further education  HOME EXERCISE PROGRAM: Access Code: [email or web address] URL: [email or web address] Date: 11/26/2022 Prepared by: Precious Bard Exercises - Seated March  - 1 x daily - 7 x weekly - 2 sets - 10 reps - 5 hold   GOALS: Goals reviewed with patient? Yes  SHORT TERM GOALS: Target date:03/06/2023     Patient will be independent  in home exercise program to improve strength/mobility for better functional independence with ADLs. Baseline: Given 6/10 8/21: intermittent compliance  Goal status: Partially Met    LONG TERM GOALS: Target date: 08/19/23    Patient will increase FOTO score to equal to or greater than 52 %   to demonstrate statistically significant improvement in mobility and quality of life.  Baseline: 5/29: 49% 8/21: 42% 10/21: 63%  Goal status: MET  2.  Patient will increase Berg Balance score to >45  to demonstrate decreased fall risk during functional activities Baseline: 5/29: 32/56 8/21: 34/56 10/21: 37/56 12/9: 43/56 Goal status: MET, REVISED  3.  Patient will increase 10 meter walk test to >1.71m/s as to improve gait speed for better community ambulation and to reduce fall risk. Baseline: 5/29: 0.64 w/ Hurry Cane in R hand and CGA 8/21: 0.67 m/s with walker 10/21: 1.0 m/s with walker  Goal status: MET  4.  Patient will increase six minute walk test distance to >1000 for progression to community ambulator and improve gait ability Baseline: 255' in 2\' 51"  w/ Hurry Cane in L hand and CGA 8/21: 605 ft with RW 10/21: 808 ft with RW 12/9: 675 ft with RW Goal status: IN PROGRESS  5. Patient will tolerate 10 seconds of single leg stance without assistance or loss of balance to reduce risk of falls.   Baseline: only able to perform with UE support   Goal status: NEW  6. Patient will ambulate >250 ft without use of AD demonstrating improved gait ability for home ambulation.   Baseline: pt requires use of RW Goal Status: NEW   ASSESSMENT:  CLINICAL IMPRESSION:  Patient's session is limited by late arrival. Patient tolerates ambulation with ankle weights this session.  Patient will benefit from skilled physical therapy to increase strength, stability, and increase functional capacity of mobility for improved quality of life.  OBJECTIVE IMPAIRMENTS: Abnormal gait, decreased activity tolerance,  decreased balance, decreased endurance, decreased knowledge of use of DME, decreased mobility, difficulty walking, decreased strength, improper body mechanics, postural dysfunction, and obesity.   ACTIVITY LIMITATIONS: carrying, bending, standing, squatting, stairs, transfers, reach over head, locomotion level, and caring for others  PARTICIPATION LIMITATIONS: meal prep, cleaning, laundry, shopping, community activity, and yard work  PERSONAL FACTORS: Age, Behavior pattern, Fitness, Past/current experiences, Profession, Sex, Time since onset of injury/illness/exacerbation, and 3+ comorbidities: HTN, HLD, Type II DM, urinary frequency, history of lyme disease, dysphonia, Phlebitis.   are also affecting patient's functional outcome.   REHAB POTENTIAL: Fair    CLINICAL DECISION MAKING: Evolving/moderate complexity  EVALUATION COMPLEXITY: Moderate  PLAN:  PT FREQUENCY: 2x/week  PT DURATION: 12 weeks  PLANNED INTERVENTIONS: Therapeutic exercises, Therapeutic activity, Neuromuscular re-education, Balance training, Gait training, Patient/Family education, Self Care, Joint mobilization, Stair training, Vestibular training, Canalith repositioning, Visual/preceptual remediation/compensation, DME instructions, Dry Needling, Cognitive remediation, Wheelchair mobility training, Spinal manipulation, Spinal mobilization, Cryotherapy, Moist heat, Splintting, Taping, Vasopneumatic device, Traction, Biofeedback, Manual therapy, and Re-evaluation  PLAN FOR NEXT SESSION: progress dynamic  stability, foot clearance & stride length, ramp navigation, ambulation on different surfaces, ambulation in // bars without assist     Precious Bard, PT, DPT Physical Therapist - [facility]  Outpatient Physical Therapy- Main Campus [phone]    07/15/2023, 3:30 PM

## 2023-07-22 ENCOUNTER — Ambulatory Visit: Payer: Medicare Other | Attending: Family Medicine

## 2023-07-22 DIAGNOSIS — R262 Difficulty in walking, not elsewhere classified: Secondary | ICD-10-CM | POA: Diagnosis present

## 2023-07-22 DIAGNOSIS — R2681 Unsteadiness on feet: Secondary | ICD-10-CM | POA: Insufficient documentation

## 2023-07-22 DIAGNOSIS — R2689 Other abnormalities of gait and mobility: Secondary | ICD-10-CM | POA: Insufficient documentation

## 2023-07-22 NOTE — Therapy (Signed)
OUTPATIENT PHYSICAL THERAPY NEURO TREATMENT  Patient Name: Karen Frank MRN: [id number] DOB:Feb 14, 1946, 78 y.o., female Today's Date: 07/22/2023   PCP: Wilford Corner PA REFERRING PROVIDER: Wilford Corner, PA  END OF SESSION:  PT End of Session - 07/22/23 1445     Visit Number 18    Number of Visits 41    Date for PT Re-Evaluation 08/19/23    Authorization Type 10/10 eval 11/14/22    PT Start Time 1445    PT Stop Time 1529    PT Time Calculation (min) 44 min    Equipment Utilized During Treatment Gait belt    Activity Tolerance Patient tolerated treatment well    Behavior During Therapy [facility] for tasks assessed/performed                           History reviewed. No pertinent past medical history. History reviewed. No pertinent surgical history. There are no active problems to display for this patient.   ONSET DATE: February 2024  REFERRING DIAG: imbalance  THERAPY DIAG:  Difficulty in walking, not elsewhere classified  Unsteadiness on feet  Other abnormalities of gait and mobility  Impairment of balance  Rationale for Evaluation and Treatment: Rehabilitation  SUBJECTIVE:                                                                                                                                                                                             SUBJECTIVE STATEMENT: Patient reports her whole body feels weak today.   Pt accompanied by: self  PERTINENT HISTORY: Patient is returning back to PT for balance deficits. She has been seen in this clinic in the past, was discharged due to no shows. Patient had a fall in November 2023. Uses a cane for ambulation of short distance.   PAIN:  Are you having pain? No  PRECAUTIONS: Fall  WEIGHT BEARING RESTRICTIONS: No  FALLS: Has patient fallen in last 6 months? Yes. Number of falls 1  LIVING ENVIRONMENT: Lives with: lives alone Lives in: House/apartment Stairs: No Has  following equipment at home: Single point cane, Walker - 2 wheeled, and shower chair  PLOF: Independent with basic ADLs  PATIENT GOALS: walk again, be able to go for long walks again  OBJECTIVE:   DIAGNOSTIC FINDINGS:  IMPRESSION: No acute intracranial abnormality.   IMPRESSION: (06/22/21) 1. Ventriculomegaly, slightly progressed from 06/15/2014. This does not appear significantly disproportionate to the cerebral sulci. This however does not exclude the diagnosis of normal pressure hydrocephalus in the appropriate context. 2. Moderate findings of follow-up chronic small vessel disease as  further detailed above. 3. Findings consistent with sequelae of remote microhemorrhage in the left posterior frontal lobe.   CT Lumbar spine without contrast 05/07/2021: 1. No acute fracture or listhesis. There is chronic-appearing anterolisthesis of L4 on L5. 2. 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    COGNITION: Overall cognitive status: Within functional limits for tasks assessed   SENSATION: WFL  COORDINATION: Heel shin test: WFL Nose finger: decreased L; WFL    POSTURE: rounded shoulders and forward head    LOWER EXTREMITY MMT:    MMT Right Eval Left Eval  Hip flexion 4 4+  Hip extension    Hip abduction 3+ 3+  Hip adduction 3 4-  Hip internal rotation    Hip external rotation    Knee flexion 3+ 3+  Knee extension 4- 4  Ankle dorsiflexion 4- 4-  Ankle plantarflexion 4- 4-  Ankle inversion    Ankle eversion    (Blank rows = not tested)  BED MOBILITY:  Patient reports independent  TRANSFERS: Assistive device utilized:  hurrycane   Sit to stand: CGA Stand to sit: CGA Chair to chair: CGA   RAMP:  *address in future session  CURB:  *address in future session    GAIT: Gait pattern: decreased arm swing- Right, decreased arm swing- Left, decreased step length- Right, decreased step length- Left, decreased stride length, decreased hip/knee flexion- Right,  decreased hip/knee flexion- Left, poor foot clearance- Right, and poor foot clearance- Left Distance walked: ~50' w/ CGA and Hurry [employer] device utilized:  Hurry cane Level of assistance: CGA Comments: Pt held [employer] in [employer], PT educated pt on proper technique for increased safety, and advised pt to switch cane to L side.    FUNCTIONAL TESTS:  5 times sit to stand: 11.1 sec 6 minute walk test: 255' in 2\' 51"  w/ Hurry Cane in L hand and CGA 10 meter walk test: 0.64 w/ Hurry Cane in R hand and CGA (see above)   Berg Balance Scale: 32/56  PATIENT SURVEYS:  FOTO 44, predicted d/c 52  TODAY'S TREATMENT:                                                                                                                              DATE: 07/22/23  Unless otherwise stated, CGA was provided and gait belt donned in order to ensure pt safety.   Therex:  Slider: seated flexion/extension 15x each LE Adduction ball squeeze 15x Adduction squeeze with heel lift 15x  Adduction squeeze with ER/IR 15x each side  TherAct: ambulate across stable and unstable surface outside. Negotiating changing surfaces; sidewalk, across brick with turns and obstacles in pathway without LOB.x20 minutes ; with RW and three rest breaks   Neuro Re-ed: Standing with CGA next to support surface:  Airex pad: static stand 30 seconds x 2 trials, noticeable trembling of ankles/LE's with fatigue and challenge to maintain stability Airex pad: horizontal head turns 30 seconds scanning  room 10x ; cueing for arc of motion  Airex pad: vertical head turns 30 seconds, cueing for arc of motion, noticeable sway with upward gaze increasing demand on ankle righting reaction musculature Airex pad: bilateral LE march 10x each LE   PATIENT EDUCATION: Education details: goals, POC, safety considerations Person educated: Patient Education method: Explanation, Demonstration, Tactile cues, and Verbal cues Education comprehension:  verbalized understanding, returned demonstration, verbal cues required, tactile cues required, and needs further education  HOME EXERCISE PROGRAM: Access Code: [email or web address] URL: [email or web address] Date: 11/26/2022 Prepared by: Precious Bard Exercises - Seated March  - 1 x daily - 7 x weekly - 2 sets - 10 reps - 5 hold   GOALS: Goals reviewed with patient? Yes  SHORT TERM GOALS: Target date:03/06/2023     Patient will be independent in home exercise program to improve strength/mobility for better functional independence with ADLs. Baseline: Given 6/10 8/21: intermittent compliance  Goal status: Partially Met    LONG TERM GOALS: Target date: 08/19/23    Patient will increase FOTO score to equal to or greater than 52 %   to demonstrate statistically significant improvement in mobility and quality of life.  Baseline: 5/29: 49% 8/21: 42% 10/21: 63%  Goal status: MET  2.  Patient will increase Berg Balance score to >45  to demonstrate decreased fall risk during functional activities Baseline: 5/29: 32/56 8/21: 34/56 10/21: 37/56 12/9: 43/56 Goal status: MET, REVISED  3.  Patient will increase 10 meter walk test to >1.24m/s as to improve gait speed for better community ambulation and to reduce fall risk. Baseline: 5/29: 0.64 w/ Hurry Cane in R hand and CGA 8/21: 0.67 m/s with walker 10/21: 1.0 m/s with walker  Goal status: MET  4.  Patient will increase six minute walk test distance to >1000 for progression to community ambulator and improve gait ability Baseline: 255' in 2\' 51"  w/ Hurry Cane in L hand and CGA 8/21: 605 ft with RW 10/21: 808 ft with RW 12/9: 675 ft with RW Goal status: IN PROGRESS  5. Patient will tolerate 10 seconds of single leg stance without assistance or loss of balance to reduce risk of falls.   Baseline: only able to perform with UE support   Goal status: NEW  6. Patient will ambulate >250 ft without use of AD demonstrating improved gait ability for  home ambulation.   Baseline: pt requires use of RW Goal Status: NEW   ASSESSMENT:  CLINICAL IMPRESSION:  Patient tolerates prolonged ambulation across changing flooring/outside areas with fatigue but excellent stability with use of RW. Patient is fatigued by end of ambulation requiring occasional seated rest breaks.  Patient will benefit from skilled physical therapy to increase strength, stability, and increase functional capacity of mobility for improved quality of life.  OBJECTIVE IMPAIRMENTS: Abnormal gait, decreased activity tolerance, decreased balance, decreased endurance, decreased knowledge of use of DME, decreased mobility, difficulty walking, decreased strength, improper body mechanics, postural dysfunction, and obesity.   ACTIVITY LIMITATIONS: carrying, bending, standing, squatting, stairs, transfers, reach over head, locomotion level, and caring for others  PARTICIPATION LIMITATIONS: meal prep, cleaning, laundry, shopping, community activity, and yard work  PERSONAL FACTORS: Age, Behavior pattern, Fitness, Past/current experiences, Profession, Sex, Time since onset of injury/illness/exacerbation, and 3+ comorbidities: HTN, HLD, Type II DM, urinary frequency, history of lyme disease, dysphonia, Phlebitis.   are also affecting patient's functional outcome.   REHAB POTENTIAL: Fair    CLINICAL DECISION MAKING: Evolving/moderate complexity  EVALUATION COMPLEXITY: Moderate  PLAN:  PT FREQUENCY: 2x/week  PT DURATION: 12 weeks  PLANNED INTERVENTIONS: Therapeutic exercises, Therapeutic activity, Neuromuscular re-education, Balance training, Gait training, Patient/Family education, Self Care, Joint mobilization, Stair training, Vestibular training, Canalith repositioning, Visual/preceptual remediation/compensation, DME instructions, Dry Needling, Cognitive remediation, Wheelchair mobility training, Spinal manipulation, Spinal mobilization, Cryotherapy, Moist heat, Splintting, Taping,  Vasopneumatic device, Traction, Biofeedback, Manual therapy, and Re-evaluation  PLAN FOR NEXT SESSION: progress dynamic stability, foot clearance & stride length, ramp navigation, ambulation on different surfaces, ambulation in // bars without assist     Precious Bard, PT, DPT Physical Therapist - [facility]  Outpatient Physical Therapy- Main Campus [phone]    07/22/2023, 3:29 PM

## 2023-07-29 ENCOUNTER — Ambulatory Visit: Payer: Medicare Other

## 2023-08-01 NOTE — Therapy (Signed)
OUTPATIENT PHYSICAL THERAPY NEURO TREATMENT  Patient Name: Karen Frank MRN: [id number] DOB:1945/09/21, 78 y.o., female Today's Date: 08/05/2023   PCP: Wilford Corner PA REFERRING PROVIDER: Wilford Corner, PA  END OF SESSION:  PT End of Session - 08/05/23 1444     Visit Number 19    Number of Visits 41    Date for PT Re-Evaluation 08/19/23    Authorization Type 10/10 eval 11/14/22    PT Start Time 1445    PT Stop Time 1529    PT Time Calculation (min) 44 min    Equipment Utilized During Treatment Gait belt    Activity Tolerance Patient tolerated treatment well    Behavior During Therapy [facility] for tasks assessed/performed                            History reviewed. No pertinent past medical history. History reviewed. No pertinent surgical history. There are no active problems to display for this patient.   ONSET DATE: February 2024  REFERRING DIAG: imbalance  THERAPY DIAG:  Difficulty in walking, not elsewhere classified  Unsteadiness on feet  Impairment of balance  Other abnormalities of gait and mobility  Rationale for Evaluation and Treatment: Rehabilitation  SUBJECTIVE:                                                                                                                                                                                             SUBJECTIVE STATEMENT: Patient had a birthday on her last appt. Missed last session due to car troubles.   Pt accompanied by: self  PERTINENT HISTORY: Patient is returning back to PT for balance deficits. She has been seen in this clinic in the past, was discharged due to no shows. Patient had a fall in November 2023. Uses a cane for ambulation of short distance.   PAIN:  Are you having pain? No  PRECAUTIONS: Fall  WEIGHT BEARING RESTRICTIONS: No  FALLS: Has patient fallen in last 6 months? Yes. Number of falls 1  LIVING ENVIRONMENT: Lives with: lives alone Lives  in: House/apartment Stairs: No Has following equipment at home: Single point cane, Walker - 2 wheeled, and shower chair  PLOF: Independent with basic ADLs  PATIENT GOALS: walk again, be able to go for long walks again  OBJECTIVE:   DIAGNOSTIC FINDINGS:  IMPRESSION: No acute intracranial abnormality.   IMPRESSION: (06/22/21) 1. Ventriculomegaly, slightly progressed from 06/15/2014. This does not appear significantly disproportionate to the cerebral sulci. This however does not exclude the diagnosis of normal pressure hydrocephalus in the appropriate context. 2. Moderate  findings of follow-up chronic small vessel disease as further detailed above. 3. Findings consistent with sequelae of remote microhemorrhage in the left posterior frontal lobe.   CT Lumbar spine without contrast 05/07/2021: 1. No acute fracture or listhesis. There is chronic-appearing anterolisthesis of L4 on L5. 2. 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    COGNITION: Overall cognitive status: Within functional limits for tasks assessed   SENSATION: WFL  COORDINATION: Heel shin test: WFL Nose finger: decreased L; WFL    POSTURE: rounded shoulders and forward head    LOWER EXTREMITY MMT:    MMT Right Eval Left Eval  Hip flexion 4 4+  Hip extension    Hip abduction 3+ 3+  Hip adduction 3 4-  Hip internal rotation    Hip external rotation    Knee flexion 3+ 3+  Knee extension 4- 4  Ankle dorsiflexion 4- 4-  Ankle plantarflexion 4- 4-  Ankle inversion    Ankle eversion    (Blank rows = not tested)  BED MOBILITY:  Patient reports independent  TRANSFERS: Assistive device utilized:  hurrycane   Sit to stand: CGA Stand to sit: CGA Chair to chair: CGA   RAMP:  *address in future session  CURB:  *address in future session    GAIT: Gait pattern: decreased arm swing- Right, decreased arm swing- Left, decreased step length- Right, decreased step length- Left, decreased stride length,  decreased hip/knee flexion- Right, decreased hip/knee flexion- Left, poor foot clearance- Right, and poor foot clearance- Left Distance walked: ~50' w/ CGA and Hurry [employer] device utilized:  Hurry cane Level of assistance: CGA Comments: Pt held [employer] in [employer], PT educated pt on proper technique for increased safety, and advised pt to switch cane to L side.    FUNCTIONAL TESTS:  5 times sit to stand: 11.1 sec 6 minute walk test: 255' in 2\' 51"  w/ Hurry Cane in L hand and CGA 10 meter walk test: 0.64 w/ Hurry Cane in R hand and CGA (see above)   Berg Balance Scale: 32/56  PATIENT SURVEYS:  FOTO 44, predicted d/c 52  TODAY'S TREATMENT:                                                                                                                              DATE: 08/05/23  Unless otherwise stated, CGA was provided and gait belt donned in order to ensure pt safety.   Therex:  3lb ankle weight:  -LAQ 12x each LE -Adduction squeeze with ER/IR 15x each side  Adduction ball squeeze 15x; 5 second holds Heel toe raise 15x  Scapular retraction 15x   TherAct: Ambulate 150 ft with RW and cues for upright posture with 3lb ankle weights; superset with 10x STS: 2 sets   3lb ankle weights: march 10x each LE Interval ambulation: fast/slow ambulation based on PT guidance with RW  300 ft  Opposite UE/Le raises seated 15x   Neuro Re-ed: Standing  with CGA next to support surface:  Airex pad: static stand 30 seconds x 2 trials, noticeable trembling of ankles/LE's with fatigue and challenge to maintain stability Airex pad: horizontal head turns 30 seconds scanning room 10x ; cueing for arc of motion  Airex pad: vertical head turns 30 seconds, cueing for arc of motion, noticeable sway with upward gaze increasing demand on ankle righting reaction musculature    PATIENT EDUCATION: Education details: goals, POC, safety considerations Person educated: Patient Education method:  Explanation, Demonstration, Tactile cues, and Verbal cues Education comprehension: verbalized understanding, returned demonstration, verbal cues required, tactile cues required, and needs further education  HOME EXERCISE PROGRAM: Access Code: [email or web address] URL: [email or web address] Date: 11/26/2022 Prepared by: Precious Bard Exercises - Seated March  - 1 x daily - 7 x weekly - 2 sets - 10 reps - 5 hold   GOALS: Goals reviewed with patient? Yes  SHORT TERM GOALS: Target date:03/06/2023     Patient will be independent in home exercise program to improve strength/mobility for better functional independence with ADLs. Baseline: Given 6/10 8/21: intermittent compliance  Goal status: Partially Met    LONG TERM GOALS: Target date: 08/19/23    Patient will increase FOTO score to equal to or greater than 52 %   to demonstrate statistically significant improvement in mobility and quality of life.  Baseline: 5/29: 49% 8/21: 42% 10/21: 63%  Goal status: MET  2.  Patient will increase Berg Balance score to >45  to demonstrate decreased fall risk during functional activities Baseline: 5/29: 32/56 8/21: 34/56 10/21: 37/56 12/9: 43/56 Goal status: MET, REVISED  3.  Patient will increase 10 meter walk test to >1.69m/s as to improve gait speed for better community ambulation and to reduce fall risk. Baseline: 5/29: 0.64 w/ Hurry Cane in R hand and CGA 8/21: 0.67 m/s with walker 10/21: 1.0 m/s with walker  Goal status: MET  4.  Patient will increase six minute walk test distance to >1000 for progression to community ambulator and improve gait ability Baseline: 255' in 2\' 51"  w/ Hurry Cane in L hand and CGA 8/21: 605 ft with RW 10/21: 808 ft with RW 12/9: 675 ft with RW Goal status: IN PROGRESS  5. Patient will tolerate 10 seconds of single leg stance without assistance or loss of balance to reduce risk of falls.   Baseline: only able to perform with UE support   Goal status: NEW  6.  Patient will ambulate >250 ft without use of AD demonstrating improved gait ability for home ambulation.   Baseline: pt requires use of RW Goal Status: NEW   ASSESSMENT:  CLINICAL IMPRESSION:  Patient is fatigued after ambulation with ankle weights and sit to stands. Patient to transition to pelvic floor PT in 4 weeks time. Patient is highly motivated throughout session.  Patient will benefit from skilled physical therapy to increase strength, stability, and increase functional capacity of mobility for improved quality of life.  OBJECTIVE IMPAIRMENTS: Abnormal gait, decreased activity tolerance, decreased balance, decreased endurance, decreased knowledge of use of DME, decreased mobility, difficulty walking, decreased strength, improper body mechanics, postural dysfunction, and obesity.   ACTIVITY LIMITATIONS: carrying, bending, standing, squatting, stairs, transfers, reach over head, locomotion level, and caring for others  PARTICIPATION LIMITATIONS: meal prep, cleaning, laundry, shopping, community activity, and yard work  PERSONAL FACTORS: Age, Behavior pattern, Fitness, Past/current experiences, Profession, Sex, Time since onset of injury/illness/exacerbation, and 3+ comorbidities: HTN, HLD, Type II DM, urinary frequency, history of lyme disease, dysphonia, Phlebitis.  are also affecting patient's functional outcome.   REHAB POTENTIAL: Fair    CLINICAL DECISION MAKING: Evolving/moderate complexity  EVALUATION COMPLEXITY: Moderate  PLAN:  PT FREQUENCY: 2x/week  PT DURATION: 12 weeks  PLANNED INTERVENTIONS: Therapeutic exercises, Therapeutic activity, Neuromuscular re-education, Balance training, Gait training, Patient/Family education, Self Care, Joint mobilization, Stair training, Vestibular training, Canalith repositioning, Visual/preceptual remediation/compensation, DME instructions, Dry Needling, Cognitive remediation, Wheelchair mobility training, Spinal manipulation, Spinal  mobilization, Cryotherapy, Moist heat, Splintting, Taping, Vasopneumatic device, Traction, Biofeedback, Manual therapy, and Re-evaluation  PLAN FOR NEXT SESSION: progress dynamic stability, foot clearance & stride length, ramp navigation, ambulation on different surfaces, ambulation in // bars without assist     Precious Bard, PT, DPT Physical Therapist - [facility]  Outpatient Physical Therapy- Main Campus [phone]    08/05/2023, 3:29 PM

## 2023-08-05 ENCOUNTER — Ambulatory Visit: Payer: Medicare Other

## 2023-08-05 DIAGNOSIS — R262 Difficulty in walking, not elsewhere classified: Secondary | ICD-10-CM

## 2023-08-05 DIAGNOSIS — R2681 Unsteadiness on feet: Secondary | ICD-10-CM

## 2023-08-05 DIAGNOSIS — R2689 Other abnormalities of gait and mobility: Secondary | ICD-10-CM

## 2023-08-08 NOTE — Therapy (Incomplete)
OUTPATIENT PHYSICAL THERAPY NEURO TREATMENT/ Physical Therapy Progress Note   Dates of reporting period  04/07/24   to   08/08/23     Patient Name: Karen Frank MRN: [id number] DOB:08-30-1945, 78 y.o., female Today's Date: 08/08/2023   PCP: Wilford Corner PA REFERRING PROVIDER: Wilford Corner, PA  END OF SESSION:                   No past medical history on file. No past surgical history on file. There are no active problems to display for this patient.   ONSET DATE: February 2024  REFERRING DIAG: imbalance  THERAPY DIAG:  No diagnosis found.  Rationale for Evaluation and Treatment: Rehabilitation  SUBJECTIVE:                                                                                                                                                                                             SUBJECTIVE STATEMENT: ***  Pt accompanied by: self  PERTINENT HISTORY: Patient is returning back to PT for balance deficits. She has been seen in this clinic in the past, was discharged due to no shows. Patient had a fall in November 2023. Uses a cane for ambulation of short distance.   PAIN:  Are you having pain? No  PRECAUTIONS: Fall  WEIGHT BEARING RESTRICTIONS: No  FALLS: Has patient fallen in last 6 months? Yes. Number of falls 1  LIVING ENVIRONMENT: Lives with: lives alone Lives in: House/apartment Stairs: No Has following equipment at home: Single point cane, Walker - 2 wheeled, and shower chair  PLOF: Independent with basic ADLs  PATIENT GOALS: walk again, be able to go for long walks again  OBJECTIVE:   DIAGNOSTIC FINDINGS:  IMPRESSION: No acute intracranial abnormality.   IMPRESSION: (06/22/21) 1. Ventriculomegaly, slightly progressed from 06/15/2014. This does not appear significantly disproportionate to the cerebral sulci. This however does not exclude the diagnosis of normal pressure hydrocephalus in  the appropriate context. 2. Moderate findings of follow-up chronic small vessel disease as further detailed above. 3. Findings consistent with sequelae of remote microhemorrhage in the left posterior frontal lobe.   CT Lumbar spine without contrast 05/07/2021: 1. No acute fracture or listhesis. There is chronic-appearing anterolisthesis of L4 on L5. 2. 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    COGNITION: Overall cognitive status: Within functional limits for tasks assessed   SENSATION: WFL  COORDINATION: Heel shin test: WFL Nose finger: decreased L; WFL    POSTURE: rounded shoulders and forward head    LOWER EXTREMITY MMT:    MMT Right Eval Left Eval  Hip flexion 4 4+  Hip extension    Hip abduction 3+ 3+  Hip adduction 3 4-  Hip internal rotation    Hip external rotation    Knee flexion 3+ 3+  Knee extension 4- 4  Ankle dorsiflexion 4- 4-  Ankle plantarflexion 4- 4-  Ankle inversion    Ankle eversion    (Blank rows = not tested)  BED MOBILITY:  Patient reports independent  TRANSFERS: Assistive device utilized:  hurrycane   Sit to stand: CGA Stand to sit: CGA Chair to chair: CGA   RAMP:  *address in future session  CURB:  *address in future session    GAIT: Gait pattern: decreased arm swing- Right, decreased arm swing- Left, decreased step length- Right, decreased step length- Left, decreased stride length, decreased hip/knee flexion- Right, decreased hip/knee flexion- Left, poor foot clearance- Right, and poor foot clearance- Left Distance walked: ~50' w/ CGA and Hurry [employer] device utilized:  Hurry cane Level of assistance: CGA Comments: Pt held [employer] in [employer], PT educated pt on proper technique for increased safety, and advised pt to switch cane to L side.    FUNCTIONAL TESTS:  5 times sit to stand: 11.1 sec 6 minute walk test: 255' in 2\' 51"  w/ Hurry Cane in L hand and CGA 10 meter walk test: 0.64 w/ Hurry Cane in R hand and CGA  (see above)   Berg Balance Scale: 32/56  PATIENT SURVEYS:  FOTO 44, predicted d/c 52  TODAY'S TREATMENT:                                                                                                                              DATE: 08/08/23  Unless otherwise stated, CGA was provided and gait belt donned in order to ensure pt safety.   Physical therapy treatment session today consisted of completing assessment of goals and administration of testing as demonstrated and documented in flow sheet, treatment, and goals section of this note. Addition treatments may be found below.   Therex:  3lb ankle weight:  -LAQ 12x each LE -Adduction squeeze with ER/IR 15x each side  Adduction ball squeeze 15x; 5 second holds Heel toe raise 15x  Scapular retraction 15x   TherAct: Ambulate 150 ft with RW and cues for upright posture with 3lb ankle weights; superset with 10x STS: 2 sets   3lb ankle weights: march 10x each LE Interval ambulation: fast/slow ambulation based on PT guidance with RW  300 ft  Opposite UE/Le raises seated 15x   Neuro Re-ed: Standing with CGA next to support surface:  Airex pad: static stand 30 seconds x 2 trials, noticeable trembling of ankles/LE's with fatigue and challenge to maintain stability Airex pad: horizontal head turns 30 seconds scanning room 10x ; cueing for arc of motion  Airex pad: vertical head turns 30 seconds, cueing for arc of motion, noticeable sway with upward gaze increasing demand on ankle righting reaction musculature    PATIENT EDUCATION: Education  details: goals, POC, safety considerations Person educated: Patient Education method: Explanation, Demonstration, Tactile cues, and Verbal cues Education comprehension: verbalized understanding, returned demonstration, verbal cues required, tactile cues required, and needs further education  HOME EXERCISE PROGRAM: Access Code: [email or web address] URL: [email or web address] Date: 11/26/2022  Prepared by: Precious Bard Exercises - Seated March  - 1 x daily - 7 x weekly - 2 sets - 10 reps - 5 hold   GOALS: Goals reviewed with patient? Yes  SHORT TERM GOALS: Target date:03/06/2023     Patient will be independent in home exercise program to improve strength/mobility for better functional independence with ADLs. Baseline: Given 6/10 8/21: intermittent compliance  Goal status: Partially Met    LONG TERM GOALS: Target date: 08/19/23    Patient will increase FOTO score to equal to or greater than 52 %   to demonstrate statistically significant improvement in mobility and quality of life.  Baseline: 5/29: 49% 8/21: 42% 10/21: 63%  Goal status: MET  2.  Patient will increase Berg Balance score to >45  to demonstrate decreased fall risk during functional activities Baseline: 5/29: 32/56 8/21: 34/56 10/21: 37/56 12/9: 43/56 Goal status: MET, REVISED  3.  Patient will increase 10 meter walk test to >1.35m/s as to improve gait speed for better community ambulation and to reduce fall risk. Baseline: 5/29: 0.64 w/ Hurry Cane in R hand and CGA 8/21: 0.67 m/s with walker 10/21: 1.0 m/s with walker  Goal status: MET  4.  Patient will increase six minute walk test distance to >1000 for progression to community ambulator and improve gait ability Baseline: 255' in 2\' 51"  w/ Hurry Cane in L hand and CGA 8/21: 605 ft with RW 10/21: 808 ft with RW 12/9: 675 ft with RW Goal status: IN PROGRESS  5. Patient will tolerate 10 seconds of single leg stance without assistance or loss of balance to reduce risk of falls.   Baseline: only able to perform with UE support   Goal status: NEW  6. Patient will ambulate >250 ft without use of AD demonstrating improved gait ability for home ambulation.   Baseline: pt requires use of RW Goal Status: NEW   ASSESSMENT:  CLINICAL IMPRESSION:  Patient's condition has the potential to improve in response to therapy. Maximum improvement is yet to be obtained.  The anticipated improvement is attainable and reasonable in a generally predictable time ***     Patient will benefit from skilled physical therapy to increase strength, stability, and increase functional capacity of mobility for improved quality of life.  OBJECTIVE IMPAIRMENTS: Abnormal gait, decreased activity tolerance, decreased balance, decreased endurance, decreased knowledge of use of DME, decreased mobility, difficulty walking, decreased strength, improper body mechanics, postural dysfunction, and obesity.   ACTIVITY LIMITATIONS: carrying, bending, standing, squatting, stairs, transfers, reach over head, locomotion level, and caring for others  PARTICIPATION LIMITATIONS: meal prep, cleaning, laundry, shopping, community activity, and yard work  PERSONAL FACTORS: Age, Behavior pattern, Fitness, Past/current experiences, Profession, Sex, Time since onset of injury/illness/exacerbation, and 3+ comorbidities: HTN, HLD, Type II DM, urinary frequency, history of lyme disease, dysphonia, Phlebitis.   are also affecting patient's functional outcome.   REHAB POTENTIAL: Fair    CLINICAL DECISION MAKING: Evolving/moderate complexity  EVALUATION COMPLEXITY: Moderate  PLAN:  PT FREQUENCY: 2x/week  PT DURATION: 12 weeks  PLANNED INTERVENTIONS: Therapeutic exercises, Therapeutic activity, Neuromuscular re-education, Balance training, Gait training, Patient/Family education, Self Care, Joint mobilization, Stair training, Vestibular training, Canalith repositioning, Visual/preceptual remediation/compensation, DME instructions, Dry Needling, Cognitive remediation,  Wheelchair mobility training, Spinal manipulation, Spinal mobilization, Cryotherapy, Moist heat, Splintting, Taping, Vasopneumatic device, Traction, Biofeedback, Manual therapy, and Re-evaluation  PLAN FOR NEXT SESSION: progress dynamic stability, foot clearance & stride length, ramp navigation, ambulation on different surfaces, ambulation  in // bars without assist     Precious Bard, PT, DPT Physical Therapist - [facility]  Outpatient Physical Therapy- [facility] [phone]    08/08/2023, 4:41 PM

## 2023-08-12 ENCOUNTER — Ambulatory Visit: Payer: Medicare Other

## 2023-08-16 NOTE — Therapy (Incomplete)
OUTPATIENT PHYSICAL THERAPY NEURO TREATMENT/ Physical Therapy Progress Note   Dates of reporting period  04/07/24   to   08/16/23     Patient Name: Karen Frank MRN: [id number] DOB:06/27/1945, 78 y.o., female Today's Date: 08/16/2023   PCP: Wilford Corner PA REFERRING PROVIDER: Wilford Corner, PA  END OF SESSION:                   No past medical history on file. No past surgical history on file. There are no active problems to display for this patient.   ONSET DATE: February 2024  REFERRING DIAG: imbalance  THERAPY DIAG:  No diagnosis found.  Rationale for Evaluation and Treatment: Rehabilitation  SUBJECTIVE:                                                                                                                                                                                             SUBJECTIVE STATEMENT: ***  Pt accompanied by: self  PERTINENT HISTORY: Patient is returning back to PT for balance deficits. She has been seen in this clinic in the past, was discharged due to no shows. Patient had a fall in November 2023. Uses a cane for ambulation of short distance.   PAIN:  Are you having pain? No  PRECAUTIONS: Fall  WEIGHT BEARING RESTRICTIONS: No  FALLS: Has patient fallen in last 6 months? Yes. Number of falls 1  LIVING ENVIRONMENT: Lives with: lives alone Lives in: House/apartment Stairs: No Has following equipment at home: Single point cane, Walker - 2 wheeled, and shower chair  PLOF: Independent with basic ADLs  PATIENT GOALS: walk again, be able to go for long walks again  OBJECTIVE:   DIAGNOSTIC FINDINGS:  IMPRESSION: No acute intracranial abnormality.   IMPRESSION: (06/22/21) 1. Ventriculomegaly, slightly progressed from 06/15/2014. This does not appear significantly disproportionate to the cerebral sulci. This however does not exclude the diagnosis of normal pressure hydrocephalus in  the appropriate context. 2. Moderate findings of follow-up chronic small vessel disease as further detailed above. 3. Findings consistent with sequelae of remote microhemorrhage in the left posterior frontal lobe.   CT Lumbar spine without contrast 05/07/2021: 1. No acute fracture or listhesis. There is chronic-appearing anterolisthesis of L4 on L5. 2. 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    COGNITION: Overall cognitive status: Within functional limits for tasks assessed   SENSATION: WFL  COORDINATION: Heel shin test: WFL Nose finger: decreased L; WFL    POSTURE: rounded shoulders and forward head    LOWER EXTREMITY MMT:    MMT Right Eval Left Eval  Hip flexion 4 4+  Hip extension    Hip abduction 3+ 3+  Hip adduction 3 4-  Hip internal rotation    Hip external rotation    Knee flexion 3+ 3+  Knee extension 4- 4  Ankle dorsiflexion 4- 4-  Ankle plantarflexion 4- 4-  Ankle inversion    Ankle eversion    (Blank rows = not tested)  BED MOBILITY:  Patient reports independent  TRANSFERS: Assistive device utilized:  hurrycane   Sit to stand: CGA Stand to sit: CGA Chair to chair: CGA   RAMP:  *address in future session  CURB:  *address in future session    GAIT: Gait pattern: decreased arm swing- Right, decreased arm swing- Left, decreased step length- Right, decreased step length- Left, decreased stride length, decreased hip/knee flexion- Right, decreased hip/knee flexion- Left, poor foot clearance- Right, and poor foot clearance- Left Distance walked: ~50' w/ CGA and Hurry [employer] device utilized:  Hurry cane Level of assistance: CGA Comments: Pt held [employer] in [employer], PT educated pt on proper technique for increased safety, and advised pt to switch cane to L side.    FUNCTIONAL TESTS:  5 times sit to stand: 11.1 sec 6 minute walk test: 255' in 2\' 51"  w/ Hurry Cane in L hand and CGA 10 meter walk test: 0.64 w/ Hurry Cane in R hand and CGA  (see above)   Berg Balance Scale: 32/56  PATIENT SURVEYS:  FOTO 44, predicted d/c 52  TODAY'S TREATMENT:                                                                                                                              DATE: 08/16/23  Unless otherwise stated, CGA was provided and gait belt donned in order to ensure pt safety.   Physical therapy treatment session today consisted of completing assessment of goals and administration of testing as demonstrated and documented in flow sheet, treatment, and goals section of this note. Addition treatments may be found below.   Therex:  3lb ankle weight:  -LAQ 12x each LE -Adduction squeeze with ER/IR 15x each side  Adduction ball squeeze 15x; 5 second holds Heel toe raise 15x  Scapular retraction 15x   TherAct: Ambulate 150 ft with RW and cues for upright posture with 3lb ankle weights; superset with 10x STS: 2 sets   3lb ankle weights: march 10x each LE Interval ambulation: fast/slow ambulation based on PT guidance with RW  300 ft  Opposite UE/Le raises seated 15x   Neuro Re-ed: Standing with CGA next to support surface:  Airex pad: static stand 30 seconds x 2 trials, noticeable trembling of ankles/LE's with fatigue and challenge to maintain stability Airex pad: horizontal head turns 30 seconds scanning room 10x ; cueing for arc of motion  Airex pad: vertical head turns 30 seconds, cueing for arc of motion, noticeable sway with upward gaze increasing demand on ankle righting reaction musculature    PATIENT EDUCATION: Education  details: goals, POC, safety considerations Person educated: Patient Education method: Explanation, Demonstration, Tactile cues, and Verbal cues Education comprehension: verbalized understanding, returned demonstration, verbal cues required, tactile cues required, and needs further education  HOME EXERCISE PROGRAM: Access Code: [email or web address] URL: [email or web address] Date: 11/26/2022  Prepared by: Precious Bard Exercises - Seated March  - 1 x daily - 7 x weekly - 2 sets - 10 reps - 5 hold   GOALS: Goals reviewed with patient? Yes  SHORT TERM GOALS: Target date:03/06/2023     Patient will be independent in home exercise program to improve strength/mobility for better functional independence with ADLs. Baseline: Given 6/10 8/21: intermittent compliance  Goal status: Partially Met    LONG TERM GOALS: Target date: 08/19/23    Patient will increase FOTO score to equal to or greater than 52 %   to demonstrate statistically significant improvement in mobility and quality of life.  Baseline: 5/29: 49% 8/21: 42% 10/21: 63%  Goal status: MET  2.  Patient will increase Berg Balance score to >45  to demonstrate decreased fall risk during functional activities Baseline: 5/29: 32/56 8/21: 34/56 10/21: 37/56 12/9: 43/56 Goal status: MET, REVISED  3.  Patient will increase 10 meter walk test to >1.39m/s as to improve gait speed for better community ambulation and to reduce fall risk. Baseline: 5/29: 0.64 w/ Hurry Cane in R hand and CGA 8/21: 0.67 m/s with walker 10/21: 1.0 m/s with walker  Goal status: MET  4.  Patient will increase six minute walk test distance to >1000 for progression to community ambulator and improve gait ability Baseline: 255' in 2\' 51"  w/ Hurry Cane in L hand and CGA 8/21: 605 ft with RW 10/21: 808 ft with RW 12/9: 675 ft with RW Goal status: IN PROGRESS  5. Patient will tolerate 10 seconds of single leg stance without assistance or loss of balance to reduce risk of falls.   Baseline: only able to perform with UE support   Goal status: NEW  6. Patient will ambulate >250 ft without use of AD demonstrating improved gait ability for home ambulation.   Baseline: pt requires use of RW Goal Status: NEW   ASSESSMENT:  CLINICAL IMPRESSION:  Patient's condition has the potential to improve in response to therapy. Maximum improvement is yet to be obtained.  The anticipated improvement is attainable and reasonable in a generally predictable time ***     Patient will benefit from skilled physical therapy to increase strength, stability, and increase functional capacity of mobility for improved quality of life.  OBJECTIVE IMPAIRMENTS: Abnormal gait, decreased activity tolerance, decreased balance, decreased endurance, decreased knowledge of use of DME, decreased mobility, difficulty walking, decreased strength, improper body mechanics, postural dysfunction, and obesity.   ACTIVITY LIMITATIONS: carrying, bending, standing, squatting, stairs, transfers, reach over head, locomotion level, and caring for others  PARTICIPATION LIMITATIONS: meal prep, cleaning, laundry, shopping, community activity, and yard work  PERSONAL FACTORS: Age, Behavior pattern, Fitness, Past/current experiences, Profession, Sex, Time since onset of injury/illness/exacerbation, and 3+ comorbidities: HTN, HLD, Type II DM, urinary frequency, history of lyme disease, dysphonia, Phlebitis.   are also affecting patient's functional outcome.   REHAB POTENTIAL: Fair    CLINICAL DECISION MAKING: Evolving/moderate complexity  EVALUATION COMPLEXITY: Moderate  PLAN:  PT FREQUENCY: 2x/week  PT DURATION: 12 weeks  PLANNED INTERVENTIONS: Therapeutic exercises, Therapeutic activity, Neuromuscular re-education, Balance training, Gait training, Patient/Family education, Self Care, Joint mobilization, Stair training, Vestibular training, Canalith repositioning, Visual/preceptual remediation/compensation, DME instructions, Dry Needling, Cognitive remediation,  Wheelchair mobility training, Spinal manipulation, Spinal mobilization, Cryotherapy, Moist heat, Splintting, Taping, Vasopneumatic device, Traction, Biofeedback, Manual therapy, and Re-evaluation  PLAN FOR NEXT SESSION: progress dynamic stability, foot clearance & stride length, ramp navigation, ambulation on different surfaces, ambulation  in // bars without assist     Precious Bard, PT, DPT Physical Therapist - [facility]  Outpatient Physical Therapy- [facility] [phone]    08/16/2023, 4:36 PM

## 2023-08-19 ENCOUNTER — Ambulatory Visit: Payer: Medicare Other

## 2023-08-22 NOTE — Therapy (Signed)
 OUTPATIENT PHYSICAL THERAPY NEURO TREATMENT/RECERT/ Physical Therapy Progress Note   Dates of reporting period  04/07/24   to   08/26/23     Patient Name: Karen Frank MRN: [id number] DOB:06-28-1945, 78 y.o., female Today's Date: 08/26/2023   PCP: Wilford Corner PA REFERRING PROVIDER: Wilford Corner, PA  END OF SESSION:  PT End of Session - 08/26/23 1443     Visit Number 20    Number of Visits 41    Date for PT Re-Evaluation 11/18/23    Authorization Type 10/10 eval 11/14/22    PT Start Time 1445    PT Stop Time 1529    PT Time Calculation (min) 44 min    Equipment Utilized During Treatment Gait belt    Activity Tolerance Patient tolerated treatment well    Behavior During Therapy [facility] for tasks assessed/performed                             History reviewed. No pertinent past medical history. History reviewed. No pertinent surgical history. There are no active problems to display for this patient.   ONSET DATE: February 2024  REFERRING DIAG: imbalance  THERAPY DIAG:  Difficulty in walking, not elsewhere classified  Unsteadiness on feet  Impairment of balance  Other abnormalities of gait and mobility  Rationale for Evaluation and Treatment: Rehabilitation  SUBJECTIVE:                                                                                                                                                                                             SUBJECTIVE STATEMENT: Patient reports she went shopping yesterday for food and is sore everywhere today.   Pt accompanied by: self  PERTINENT HISTORY: Patient is returning back to PT for balance deficits. She has been seen in this clinic in the past, was discharged due to no shows. Patient had a fall in November 2023. Uses a cane for ambulation of short distance.   PAIN:  Are you having pain? No  PRECAUTIONS: Fall  WEIGHT BEARING RESTRICTIONS: No  FALLS: Has patient  fallen in last 6 months? Yes. Number of falls 1  LIVING ENVIRONMENT: Lives with: lives alone Lives in: House/apartment Stairs: No Has following equipment at home: Single point cane, Walker - 2 wheeled, and shower chair  PLOF: Independent with basic ADLs  PATIENT GOALS: walk again, be able to go for long walks again  OBJECTIVE:   DIAGNOSTIC FINDINGS:  IMPRESSION: No acute intracranial abnormality.   IMPRESSION: (06/22/21) 1. Ventriculomegaly, slightly progressed from 06/15/2014. This does not appear significantly disproportionate to  the cerebral sulci. This however does not exclude the diagnosis of normal pressure hydrocephalus in the appropriate context. 2. Moderate findings of follow-up chronic small vessel disease as further detailed above. 3. Findings consistent with sequelae of remote microhemorrhage in the left posterior frontal lobe.   CT Lumbar spine without contrast 05/07/2021: 1. No acute fracture or listhesis. There is chronic-appearing anterolisthesis of L4 on L5. 2. 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    COGNITION: Overall cognitive status: Within functional limits for tasks assessed   SENSATION: WFL  COORDINATION: Heel shin test: WFL Nose finger: decreased L; WFL    POSTURE: rounded shoulders and forward head    LOWER EXTREMITY MMT:    MMT Right Eval Left Eval  Hip flexion 4 4+  Hip extension    Hip abduction 3+ 3+  Hip adduction 3 4-  Hip internal rotation    Hip external rotation    Knee flexion 3+ 3+  Knee extension 4- 4  Ankle dorsiflexion 4- 4-  Ankle plantarflexion 4- 4-  Ankle inversion    Ankle eversion    (Blank rows = not tested)  BED MOBILITY:  Patient reports independent  TRANSFERS: Assistive device utilized:  hurrycane   Sit to stand: CGA Stand to sit: CGA Chair to chair: CGA   RAMP:  *address in future session  CURB:  *address in future session    GAIT: Gait pattern: decreased arm swing- Right, decreased  arm swing- Left, decreased step length- Right, decreased step length- Left, decreased stride length, decreased hip/knee flexion- Right, decreased hip/knee flexion- Left, poor foot clearance- Right, and poor foot clearance- Left Distance walked: ~50' w/ CGA and Hurry [employer] device utilized:  Hurry cane Level of assistance: CGA Comments: Pt held [employer] in [employer], PT educated pt on proper technique for increased safety, and advised pt to switch cane to L side.    FUNCTIONAL TESTS:  5 times sit to stand: 11.1 sec 6 minute walk test: 255' in 2\' 51"  w/ Hurry Cane in L hand and CGA 10 meter walk test: 0.64 w/ Hurry Cane in R hand and CGA (see above)   Berg Balance Scale: 32/56  PATIENT SURVEYS:  FOTO 44, predicted d/c 52  TODAY'S TREATMENT:                                                                                                                              DATE: 08/26/23  Unless otherwise stated, CGA was provided and gait belt donned in order to ensure pt safety.   Physical therapy treatment session today consisted of completing assessment of goals and administration of testing as demonstrated and documented in flow sheet, treatment, and goals section of this note. Addition treatments may be found below.    [facility] PT Assessment - 08/26/23 0001       Berg Balance Test   Sit to Stand Able to stand without using hands and stabilize independently  Standing Unsupported Able to stand safely 2 minutes    Sitting with Back Unsupported but Feet Supported on Floor or Stool Able to sit safely and securely 2 minutes    Stand to Sit Sits safely with minimal use of hands    Transfers Able to transfer safely, minor use of hands    Standing Unsupported with Eyes Closed Able to stand 10 seconds safely    Standing Unsupported with Feet Together Able to place feet together independently and stand 1 minute safely    From Standing, Reach Forward with Outstretched Arm Can reach forward >12 cm  safely (5")    From Standing Position, Pick up Object from Floor Able to pick up shoe, needs supervision    From Standing Position, Turn to Look Behind Over each Shoulder Looks behind from both sides and weight shifts well    Turn 360 Degrees Able to turn 360 degrees safely one side only in 4 seconds or less    Standing Unsupported, Alternately Place Feet on Step/Stool Able to complete 4 steps without aid or supervision    Standing Unsupported, One Foot in Front Able to plae foot ahead of the other independently and hold 30 seconds    Standing on One Leg Tries to lift leg/unable to hold 3 seconds but remains standing independently    Total Score 47              Therex:  GTB abduction 15x GTB march 15x each LE GTB hamstring curl 15x  Heel toe raises 15x   TherAct: 6" step: -step up/down 10x each LE -lateral step up/down 10x each LE       PATIENT EDUCATION: Education details: goals, POC, safety considerations Person educated: Patient Education method: Explanation, Demonstration, Tactile cues, and Verbal cues Education comprehension: verbalized understanding, returned demonstration, verbal cues required, tactile cues required, and needs further education  HOME EXERCISE PROGRAM: Access Code: [email or web address] URL: [email or web address] Date: 11/26/2022 Prepared by: Precious Bard Exercises - Seated March  - 1 x daily - 7 x weekly - 2 sets - 10 reps - 5 hold   GOALS: Goals reviewed with patient? Yes  SHORT TERM GOALS: Target date:03/06/2023     Patient will be independent in home exercise program to improve strength/mobility for better functional independence with ADLs. Baseline: Given 6/10 8/21: intermittent compliance  Goal status: Partially Met    LONG TERM GOALS: Target date: 11/18/2023  Patient will increase FOTO score to equal to or greater than 52 %   to demonstrate statistically significant improvement in mobility and quality of life.  Baseline: 5/29: 49%  8/21: 42% 10/21: 63%  Goal status: MET  2.  Patient will increase Berg Balance score to >45  to demonstrate decreased fall risk during functional activities Baseline: 5/29: 32/56 8/21: 34/56 10/21: 37/56 12/9: 43/56 3/10: 47  Goal status: MET  3.  Patient will increase 10 meter walk test to >1.27m/s as to improve gait speed for better community ambulation and to reduce fall risk. Baseline: 5/29: 0.64 w/ Hurry Cane in R hand and CGA 8/21: 0.67 m/s with walker 10/21: 1.0 m/s with walker  Goal status: MET  4.  Patient will increase six minute walk test distance to >1000 for progression to community ambulator and improve gait ability Baseline: 255' in 2\' 51"  w/ Hurry Cane in L hand and CGA 8/21: 605 ft with RW 10/21: 808 ft with RW 12/9: 675 ft with RW 3/10: 901 ft with rw  Goal status: IN PROGRESS  5. Patient will tolerate 10 seconds of single leg stance without assistance or loss of balance to reduce risk of falls.   Baseline: only able to perform with UE support  3/10: 3 seconds Goal status: partially met   6. Patient will ambulate >250 ft without use of AD demonstrating improved gait ability for home ambulation.   Baseline: pt requires use of RW 3/10: 95 ft with SPC Goal Status: Partially Met   ASSESSMENT:  CLINICAL IMPRESSION:  Patient's condition has the potential to improve in response to therapy. Maximum improvement is yet to be obtained. The anticipated improvement is attainable and reasonable in a generally predictable time This is last neuro PT session, will go to pelvic PT starting next session. Lateral stepping up stairs is more challenging than vertically.  Patient will benefit from skilled physical therapy to increase strength, stability, and increase functional capacity of mobility for improved quality of life.  OBJECTIVE IMPAIRMENTS: Abnormal gait, decreased activity tolerance, decreased balance, decreased endurance, decreased knowledge of use of DME, decreased mobility,  difficulty walking, decreased strength, improper body mechanics, postural dysfunction, and obesity.   ACTIVITY LIMITATIONS: carrying, bending, standing, squatting, stairs, transfers, reach over head, locomotion level, and caring for others  PARTICIPATION LIMITATIONS: meal prep, cleaning, laundry, shopping, community activity, and yard work  PERSONAL FACTORS: Age, Behavior pattern, Fitness, Past/current experiences, Profession, Sex, Time since onset of injury/illness/exacerbation, and 3+ comorbidities: HTN, HLD, Type II DM, urinary frequency, history of lyme disease, dysphonia, Phlebitis.   are also affecting patient's functional outcome.   REHAB POTENTIAL: Fair    CLINICAL DECISION MAKING: Evolving/moderate complexity  EVALUATION COMPLEXITY: Moderate  PLAN:  PT FREQUENCY: 2x/week  PT DURATION: 12 weeks  PLANNED INTERVENTIONS: Therapeutic exercises, Therapeutic activity, Neuromuscular re-education, Balance training, Gait training, Patient/Family education, Self Care, Joint mobilization, Stair training, Vestibular training, Canalith repositioning, Visual/preceptual remediation/compensation, DME instructions, Dry Needling, Cognitive remediation, Wheelchair mobility training, Spinal manipulation, Spinal mobilization, Cryotherapy, Moist heat, Splintting, Taping, Vasopneumatic device, Traction, Biofeedback, Manual therapy, and Re-evaluation  PLAN FOR NEXT SESSION: pelvic PT    Precious Bard, PT, DPT Physical Therapist - [facility]  Outpatient Physical Therapy- Main Campus [phone]    08/26/2023, 3:32 PM

## 2023-08-26 ENCOUNTER — Ambulatory Visit: Payer: Medicare Other | Attending: Family Medicine

## 2023-08-26 DIAGNOSIS — R2689 Other abnormalities of gait and mobility: Secondary | ICD-10-CM | POA: Insufficient documentation

## 2023-08-26 DIAGNOSIS — R262 Difficulty in walking, not elsewhere classified: Secondary | ICD-10-CM | POA: Insufficient documentation

## 2023-08-26 DIAGNOSIS — R2681 Unsteadiness on feet: Secondary | ICD-10-CM | POA: Diagnosis present

## 2023-09-02 ENCOUNTER — Ambulatory Visit: Payer: Medicare Other

## 2023-09-02 ENCOUNTER — Ambulatory Visit: Payer: Medicare Other | Admitting: Physical Therapy

## 2023-09-09 ENCOUNTER — Ambulatory Visit: Payer: Medicare Other | Admitting: Physical Therapy

## 2023-09-16 ENCOUNTER — Ambulatory Visit: Payer: Medicare Other | Admitting: Physical Therapy

## 2023-09-17 ENCOUNTER — Ambulatory Visit: Admitting: Physical Therapy

## 2023-09-23 ENCOUNTER — Ambulatory Visit: Payer: Medicare Other | Admitting: Physical Therapy

## 2023-09-24 ENCOUNTER — Ambulatory Visit: Admitting: Physical Therapy

## 2023-09-24 ENCOUNTER — Encounter: Payer: Self-pay | Admitting: Physical Therapy

## 2023-09-24 ENCOUNTER — Ambulatory Visit: Attending: Family Medicine | Admitting: Physical Therapy

## 2023-09-24 DIAGNOSIS — M533 Sacrococcygeal disorders, not elsewhere classified: Secondary | ICD-10-CM | POA: Diagnosis present

## 2023-09-24 DIAGNOSIS — R293 Abnormal posture: Secondary | ICD-10-CM

## 2023-09-24 DIAGNOSIS — R2689 Other abnormalities of gait and mobility: Secondary | ICD-10-CM

## 2023-09-24 DIAGNOSIS — M6281 Muscle weakness (generalized): Secondary | ICD-10-CM | POA: Diagnosis present

## 2023-09-24 NOTE — Therapy (Unsigned)
 OUTPATIENT PHYSICAL THERAPY EVALUATION   Patient Name: Karen Frank MRN: [id number] DOB:1945/12/19, 78 y.o., female Today's Date: 09/24/2023   PT End of Session - 09/24/23 1555     Visit Number 1    Number of Visits 10    Date for PT Re-Evaluation 12/03/23    PT Start Time 1553    PT Stop Time 1635    PT Time Calculation (min) 42 min             Past Medical History:  Diagnosis Date   Essential hypertension    Migraines    Mitral valve prolapse    Ruptured disc, cervical    Past Surgical History:  Procedure Laterality Date   APPENDECTOMY     HAND SURGERY      Active Problems  Problem Noted Date Diagnosed Date  Essential hypertension 09/17/2022    Hyperlipidemia 09/17/2022    Controlled type 2 diabetes mellitus without complication, without long-term current use of insulin (CMS/HHS-HCC) 09/17/2022    Phlebitis 09/14/2014    History of Lyme disease 09/14/2014    Obstructive sleep apnea-hypopnea syndrome 04/15/2014    Dysphonia 11/02/2013      PCP: Debbra Riding, PA-C   REFERRING PROVIDER:  Debbra Riding, PA-C   REFERRING DIAG: Mixed Urinary Incontinence  Rationale for Evaluation and Treatment Rehabilitation  THERAPY DIAG:  Sacrococcygeal disorders, not elsewhere classified  Muscle weakness (generalized)  Abnormal posture  ONSET DATE: 1+year ago   SUBJECTIVE:                                                                                                                                                                                           SUBJECTIVE STATEMENT: 1) urinary frequency :   a. Daytime: Pt does not have leakage during the daytime and makes it to the bathroom in time without leakage. During the day, her frequency is 4 x every 2 hours. Daily fluid intake:  "I do not drink plain water" ,  Pt mixes juice with water and drinks 48 fl oz per day.                                          b. Nightime: 6 x/ night .  Last night, pt had leakage  as soon as she walked to from the bed to the toilet.    Pt had been Dx with OSA in 2015 based on medical records and pt reported she was not given a CPAP machine when she was Dx with OSA in 2015.  Pt uses 4-6 diapers in during day and 3 at night. Pt uses paper towels inside her diapers. She throws away 6 of the paper towels before the diaper gets wet ( late morning, afternoon, late afternoon, evening)   2) fecal leakage:  occurs 3 x across one week.   Bowel movements occurs every 1-3 days.   Does not strain with bowel movement . Stool consistency Type 4.     3) difficulty with walking: "I can walk around my house a little but need the cane".  When she goes shopping, she uses her RW and then sits in the carts to do her shopping.    PERTINENT HISTORY:   Pt has had no children. Surgeries:  Appendectomy.     PAIN:  Are you having pain? No   PRECAUTIONS: None  WEIGHT BEARING RESTRICTIONS: No  FALLS:  Has patient fallen in last 6 months? No  LIVING ENVIRONMENT: Lives with: alone Lives in: house Stairs: no STE, 1 floor house  Has following equipment at home: rolling walker in the community  , [facility] which she uses around the house   OCCUPATION: retired from [employer] operational officer   PLOF:  no problem with fecal and urinary leakage before   PATIENT GOALS: be able to walk again, less leakage    OBJECTIVE:    [facility] PT Assessment       Observation/Other Assessments   Skin Integrity long toe nails B, dark coloration, dead skin cells underneath nails, , Pt plans to get to a doctor to help her cut the nails.      Strength   Overall Strength Comments L knee flexion, ankle plantarflexion, DF/EV 4/5,  R LE 5/5 overall      Palpation   SI assessment  R shoulder, L iliac crest higher    Palpation comment stiff feet B    Ambulation/Gait  Gait Comments 0.51 m/s ( gait belt , SBA)  short strides, poor push up, decreased stance on L            [facility] Adult PT Treatment/Exercise -                Self-Care   Self-Care Other Self-Care Comments    Other Self-Care Comments  explained the correlation with OSA and nocturia, provided information, discussed getting updated sleep study to rule in/out OSA       Therapeutic Activites    Therapeutic Activities Other Therapeutic Activities    Other Therapeutic Activities   encouraged increasing water intake , deceasing juice intake , explained plan to address uneven shoulder and pelvic height to help with walking and then work towards leakage issues               HOME EXERCISE PROGRAM: See pt instruction section    ASSESSMENT:  CLINICAL IMPRESSION:   Pt is a  78 yo who presents with  following issues which impact gait ,  QOL, ADL, social and community activities:   urinary frequency   fecal leakage  difficulty with walking  Pt's musculoskeletal assessment revealed uneven pelvic girdle and shoulder height, asymmetries to gait pattern, limited spinal /pelvic mobility, dyscoordination and strength of pelvic floor mm, hip weakness, poor body mechanics which places strain on the abdominal/pelvic floor mm. These are deficits that indicate an ineffective intraabdominal pressure system associated with increased risk for pt's Sx.    Pt was provided education on etiology of Sx with anatomy, physiology explanation with images along with the benefits of customized pelvic PT  Tx based on pt's medical conditions and musculoskeletal deficits.  Explained the physiology of deep core mm coordination and roles of pelvic floor function in urination, defecation, sexual function, and postural control with deep core mm system.    Regional interdependent approaches will yield greater benefits in pt's POC.   Today's Tx include:  explaining the correlation with OSA and nocturia, provided information, discussed getting updated sleep study to rule in/out OSA because pt's medical records indicated OSA in 2015 and pt reported she was not aware.    Encouraged increasing water intake , deceasing juice intake , explained plan to address uneven shoulder and pelvic height to help with walking and then work towards leakage issues.  Pt's mobility must be addressed first  before addressing pelvic floor to prevent fall risk.   Anticipate by addressing realignment of spine/ pelvis at next session will help promote optimize IAP system for improved pelvic floor function, trunk stability, gait, balance, stabilization with mobility tasks.  Plan to address pelvic floor issues once pelvis and spine are realigned to yield better outcomes.   Pt benefits from skilled PT.       OBJECTIVE IMPAIRMENTS decreased activity tolerance, decreased coordination, decreased endurance, decreased mobility, difficulty walking, decreased ROM, decreased strength, decreased safety awareness, hypomobility, increased muscle spasms, impaired flexibility, improper body mechanics, postural dysfunction, and. scar restrictions   ACTIVITY LIMITATIONS  self-care,  home chores, sleep    PARTICIPATION LIMITATIONS:  community, ADLs  activities    PERSONAL FACTORS        are also affecting patient's functional outcome.    REHAB POTENTIAL: Good   CLINICAL DECISION MAKING: Evolving/moderate complexity   EVALUATION COMPLEXITY: Moderate    PATIENT EDUCATION:    Education details: Showed pt anatomy images. Explained muscles attachments/ connection, physiology of deep core system/ spinal- thoracic-pelvis-lower kinetic chain as they relate to pt's presentation, Sx, and past Hx. Explained what and how these areas of deficits need to be restored to balance and function    See Therapeutic activity / neuromuscular re-education section  Answered pt's questions.   Person educated: Patient Education method: Explanation, Demonstration, Tactile cues, Verbal cues, and Handouts Education comprehension: verbalized understanding, returned demonstration, verbal cues required, tactile cues  required, and needs further education     PLAN: PT FREQUENCY: 1x/week   PT DURATION: 10 weeks   PLANNED INTERVENTIONS: Therapeutic exercises, Therapeutic activity, Neuromuscular re-education, Balance training, Gait training, Patient/Family education, Self Care, Joint mobilization, Spinal mobilization, Moist heat, Taping, and Manual therapy, dry needling.   PLAN FOR NEXT SESSION: See clinical impression for plan     GOALS: Goals reviewed with patient? Yes  SHORT TERM GOALS: Target date: 10/22/2023    Pt will demo IND with HEP                    Baseline: Not IND            Goal status: INITIAL   LONG TERM GOALS: Target date: 12/03/2023    1.Pt will demo proper deep core coordination without chest breathing and optimal excursion of diaphragm/pelvic floor in order to promote spinal stability and pelvic floor function  Baseline: dyscoordination Goal status: INITIAL  2.  Pt will demo proper body mechanics in against gravity tasks and ADLs  work tasks, fitness  to minimize straining pelvic floor / back    Baseline: not IND, improper form that places strain on pelvic floor  Goal status: INITIAL    3. Pt will demo increased gait  speed > 0.8  m/s with reciprocal gait pattern, longer stride length  in order to ambulate safely in community and return to fitness routine  Baseline: 0.51 m/s ( gait belt , SBA)  short strides, poor push up, decreased stance on L  Goal status: INITIAL    4. Pt will report  increase water intake from 0 fl oz to > 16  fl oz,   decrease juice  from 48 floz to 32 fl oz.    Baseline:  Daily fluid intake:  water  0__  fl oz, juice 48 floz Goal status: INITIAL      5. Pt will report BMs occurring daily   Less fecal leakage < 2 x week   Baseline:  BMs occur _1-3 every days  a week Fecal leakage occurs 3 x across one week.   Bowel movements occurs every 1-3 days.   Goal status: INITIAL   6. Pt will demo levelled pelvic girdle and shoulder height  in order to progress to deep core strengthening HEP and restore mobility at spine, pelvis, gait, posture minimize falls, and improve balance   Baseline: R shoulder, L iliac crest higher  Goal status: INITIAL   7.  Pt will report decreased diaper use 2-3 diapers during the day and < 2 at night or weaning to urinary pads and not using paper towels inside diapers for improved hygiene. Baseline: Pt uses 4-6 diapers in during day and 3 at night. Pt uses paper towels inside her diapers. She throws away 6 of the paper towels before the diaper gets wet ( late morning, afternoon, late afternoon, evening)  Goal status: INITIAL  8. Pt 's PCP will be contacted about updated sleep study to rule in/out OSA Baseline:  pt was Dx with OSA in 2015 based on medical records  Goal status: INITIAL    Mariane Masters, PT 09/24/2023, 4:09 PM   Mariane Masters, PT 09/24/2023, 4:24 PM

## 2023-09-24 NOTE — Therapy (Deleted)
 OUTPATIENT PHYSICAL THERAPY EVALUATION   Patient Name: Karen Frank MRN: [id number] DOB:June 16, 1946, 78 y.o., female Today's Date: 09/24/2023   PT End of Session - 09/24/23 1555     Visit Number 1    Number of Visits 10    Date for PT Re-Evaluation 12/03/23    PT Start Time 1553    PT Stop Time 1635    PT Time Calculation (min) 42 min             Past Medical History:  Diagnosis Date   Essential hypertension    Migraines    Mitral valve prolapse    Ruptured disc, cervical    Past Surgical History:  Procedure Laterality Date   APPENDECTOMY     HAND SURGERY     There are no active problems to display for this patient.   PCP: Debbra Riding, PA-C   REFERRING PROVIDER:  Debbra Riding, PA-C   REFERRING DIAG: R10.2 (ICD-10-CM) - Pelvic pain syndrome   Rationale for Evaluation and Treatment {HABREHAB:27488}  THERAPY DIAG:  Sacrococcygeal disorders, not elsewhere classified  Muscle weakness (generalized)  Abnormal posture  ONSET DATE: ***  SUBJECTIVE:                                                                                                                                                                                           SUBJECTIVE STATEMENT: ***  PERTINENT HISTORY:  ***  PAIN:  Are you having pain? Yes: {yespain:27235::"NPRS scale: ***","Pain location: ***","Pain description: ***","Aggravating factors: ***","Relieving factors: ***"}  PRECAUTIONS: {Therapy precautions:24002}  WEIGHT BEARING RESTRICTIONS: {Yes ***/No:24003}  FALLS:  Has patient fallen in last 6 months? {fallsyesno:27318}  LIVING ENVIRONMENT: Lives with: {[facility] lives with:25569::"lives with their family"} Lives in: {Lives in:25570} Stairs: {opstairs:27293} Has following equipment at home: {Assistive devices:23999}  OCCUPATION: ***  PLOF: {PLOF:24004}  PATIENT GOALS: ***   OBJECTIVE:      HOME EXERCISE PROGRAM: See pt instruction section     ASSESSMENT:  CLINICAL IMPRESSION: **   Pt benefits from skilled PT.    OBJECTIVE IMPAIRMENTS decreased activity tolerance, decreased coordination, decreased endurance, decreased mobility, difficulty walking, decreased ROM, decreased strength, decreased safety awareness, hypomobility, increased muscle spasms, impaired flexibility, improper body mechanics, postural dysfunction, and. scar restrictions   ACTIVITY LIMITATIONS  self-care,  sleep, home chores, work tasks    PARTICIPATION LIMITATIONS:  community, ADLs  activities    PERSONAL FACTORS        are also affecting patient's functional outcome.    REHAB POTENTIAL: Good   CLINICAL DECISION MAKING: Evolving/moderate complexity   EVALUATION COMPLEXITY: Moderate    PATIENT EDUCATION:  Education details: Showed pt anatomy images. Explained muscles attachments/ connection, physiology of deep core system/ spinal- thoracic-pelvis-lower kinetic chain as they relate to pt's presentation, Sx, and past Hx. Explained what and how these areas of deficits need to be restored to balance and function    See Therapeutic activity / neuromuscular re-education section  Answered pt's questions.   Person educated: Patient Education method: Explanation, Demonstration, Tactile cues, Verbal cues, and Handouts Education comprehension: verbalized understanding, returned demonstration, verbal cues required, tactile cues required, and needs further education     PLAN: PT FREQUENCY: 1x/week   PT DURATION: 10 weeks   PLANNED INTERVENTIONS: Therapeutic exercises, Therapeutic activity, Neuromuscular re-education, Balance training, Gait training, Patient/Family education, Self Care, Joint mobilization, Spinal mobilization, Moist heat, Taping, and Manual therapy, dry needling.   PLAN FOR NEXT SESSION: See clinical impression for plan     GOALS: Goals reviewed with patient? Yes  SHORT TERM GOALS: Target date: 10/22/2023    Pt will demo IND  with HEP                    Baseline: Not IND            Goal status: INITIAL   LONG TERM GOALS: Target date: 12/03/2023    1.Pt will demo proper deep core coordination without chest breathing and optimal excursion of diaphragm/pelvic floor in order to promote spinal stability and pelvic floor function  Baseline: dyscoordination Goal status: INITIAL  2.  Pt will demo proper body mechanics in against gravity tasks and ADLs  work tasks, fitness  to minimize straining pelvic floor / back    Baseline: not IND, improper form that places strain on pelvic floor  Goal status: INITIAL    3. Pt will demo increased gait speed > 1.3 m/s with reciprocal gait pattern, longer stride length  in order to ambulate safely in community and return to fitness routine  Baseline:  Goal status: INITIAL    4.  Baseline:  Goal status: INITIAL   5.  Baseline:  Goal status: INITIAL    Mariane Masters, PT 09/24/2023, 4:09 PM

## 2023-09-25 NOTE — Addendum Note (Signed)
 Addended by: Mariane Masters on: 09/25/2023 01:12 PM   Modules accepted: Orders

## 2023-09-30 ENCOUNTER — Ambulatory Visit: Payer: Medicare Other | Admitting: Physical Therapy

## 2023-10-01 ENCOUNTER — Ambulatory Visit: Admitting: Physical Therapy

## 2023-10-07 ENCOUNTER — Ambulatory Visit: Payer: Medicare Other | Admitting: Physical Therapy

## 2023-10-08 ENCOUNTER — Ambulatory Visit: Admitting: Physical Therapy

## 2023-10-10 ENCOUNTER — Ambulatory Visit: Payer: Medicare Other | Admitting: Physical Therapy

## 2023-10-14 ENCOUNTER — Ambulatory Visit: Payer: Medicare Other | Admitting: Physical Therapy

## 2023-10-16 ENCOUNTER — Encounter: Admitting: Physical Therapy

## 2023-10-16 ENCOUNTER — Ambulatory Visit: Admitting: Physical Therapy

## 2023-10-21 ENCOUNTER — Ambulatory Visit: Payer: Medicare Other | Admitting: Physical Therapy

## 2023-10-23 ENCOUNTER — Ambulatory Visit: Admitting: Physical Therapy

## 2023-10-24 ENCOUNTER — Ambulatory Visit: Admitting: Physical Therapy

## 2023-10-28 ENCOUNTER — Ambulatory Visit: Payer: Medicare Other | Admitting: Physical Therapy

## 2023-10-29 ENCOUNTER — Ambulatory Visit: Admitting: Physical Therapy

## 2023-10-29 ENCOUNTER — Ambulatory Visit: Payer: Medicare Other | Admitting: Physical Therapy

## 2023-11-18 ENCOUNTER — Ambulatory Visit: Payer: Medicare Other | Admitting: Physical Therapy

## 2023-11-20 ENCOUNTER — Ambulatory Visit: Attending: Family Medicine | Admitting: Physical Therapy

## 2023-11-20 DIAGNOSIS — M6281 Muscle weakness (generalized): Secondary | ICD-10-CM | POA: Diagnosis present

## 2023-11-20 DIAGNOSIS — R293 Abnormal posture: Secondary | ICD-10-CM | POA: Insufficient documentation

## 2023-11-20 DIAGNOSIS — M533 Sacrococcygeal disorders, not elsewhere classified: Secondary | ICD-10-CM | POA: Diagnosis present

## 2023-11-20 DIAGNOSIS — R2689 Other abnormalities of gait and mobility: Secondary | ICD-10-CM | POA: Diagnosis present

## 2023-11-20 NOTE — Patient Instructions (Signed)
  Occupational hygienist at [email or web address]     Two pieces for the  Right shoe    One Large, for toe box  One medium for the heel     ** It comes with thick piece and   be sure to peel top piece off and use that piece in the shoe  ** REPLACE every 6 months as they get worn down     ___________  ONLY TO THE R TO ADDRESS SCOLIOSIS    Seated inhale, exhale rotate to the R  10 reps   Seated inhale, exhale, arch over like rainbow with L arm , leaning to the R , 10 reps   3- 5  x a day

## 2023-11-20 NOTE — Therapy (Unsigned)
 OUTPATIENT PHYSICAL THERAPY TREATMENT    Patient Name: Karen Frank MRN: [id number] DOB:1945-09-21, 78 y.o., female Today's Date: 11/20/2023   PT End of Session - 11/20/23 1516     Visit Number 2    Number of Visits 10    Date for PT Re-Evaluation 12/03/23    PT Start Time 1515    PT Stop Time 1555    PT Time Calculation (min) 40 min    Activity Tolerance Patient tolerated treatment well    Behavior During Therapy [facility] for tasks assessed/performed             Past Medical History:  Diagnosis Date   Essential hypertension    Migraines    Mitral valve prolapse    Ruptured disc, cervical    Past Surgical History:  Procedure Laterality Date   APPENDECTOMY     HAND SURGERY      Active Problems  Problem Noted Date Diagnosed Date  Essential hypertension 09/17/2022    Hyperlipidemia 09/17/2022    Controlled type 2 diabetes mellitus without complication, without long-term current use of insulin (CMS/HHS-HCC) 09/17/2022    Phlebitis 09/14/2014    History of Lyme disease 09/14/2014    Obstructive sleep apnea-hypopnea syndrome 04/15/2014    Dysphonia 11/02/2013      PCP: Bruce Caper, PA-C   REFERRING PROVIDER:  Bruce Caper, PA-C   REFERRING DIAG: Mixed Urinary Incontinence  Rationale for Evaluation and Treatment Rehabilitation  THERAPY DIAG:  Sacrococcygeal disorders, not elsewhere classified  Muscle weakness (generalized)  Abnormal posture  ONSET DATE: 1+year ago   SUBJECTIVE:                                                                                                                                                                                           SUBJECTIVE STATEMENT TODAY:   Pt notices leakage is worse in the evening from 7pm until midnight -1am and then it is not soo bad after that. Sometimes she is not able to make it to the bathroom without leakage.   Leakage is not as bad during the night.       SUBJECTIVE STATEMENT ON EVAL  4/ 8/ 25 : 1) urinary frequency :   a. Daytime: Pt does not have leakage during the daytime and makes it to the bathroom in time without leakage. During the day, her frequency is 4 x every 2 hours. Daily fluid intake:  "I do not drink plain water" ,  Pt mixes juice with water and drinks 48 fl oz per day.  b. Nightime: 6 x/ night .  Last night, pt had leakage as soon as she walked to from the bed to the toilet.    Pt had been Dx with OSA in 2015 based on medical records and pt reported she was not given a CPAP machine when she was Dx with OSA in 2015.   Pt uses 4-6 diapers in during day and 3 at night. Pt uses paper towels inside her diapers. She throws away 6 of the paper towels before the diaper gets wet ( late morning, afternoon, late afternoon, evening)   2) fecal leakage:  occurs 3 x across one week.   Bowel movements occurs every 1-3 days.   Does not strain with bowel movement . Stool consistency Type 4.     3) difficulty with walking: "I can walk around my house a little but need the cane".  When she goes shopping, she uses her RW and then sits in the carts to do her shopping.    PERTINENT HISTORY:   Pt has had no children. Surgeries:  Appendectomy.     PAIN:  Are you having pain? No   PRECAUTIONS: None  WEIGHT BEARING RESTRICTIONS: No  FALLS:  Has patient fallen in last 6 months? No  LIVING ENVIRONMENT: Lives with: alone Lives in: house Stairs: no STE, 1 floor house  Has following equipment at home: rolling walker in the community  , [facility] which she uses around the house   OCCUPATION: retired from [employer] operational officer   PLOF:  no problem with fecal and urinary leakage before   PATIENT GOALS: be able to walk again, less leakage    OBJECTIVE:       HOME EXERCISE PROGRAM: See pt instruction section    ASSESSMENT:  CLINICAL IMPRESSION:   Pt is a  78 yo who presents with  following issues which impact gait ,  QOL, ADL, social and  community activities:   urinary frequency   fecal leakage  difficulty with walking  Pt's musculoskeletal assessment revealed uneven pelvic girdle and shoulder height, asymmetries to gait pattern, limited spinal /pelvic mobility, dyscoordination and strength of pelvic floor mm, hip weakness, poor body mechanics which places strain on the abdominal/pelvic floor mm. These are deficits that indicate an ineffective intraabdominal pressure system associated with increased risk for pt's Sx.    Pt was provided education on etiology of Sx with anatomy, physiology explanation with images along with the benefits of customized pelvic PT Tx based on pt's medical conditions and musculoskeletal deficits.  Explained the physiology of deep core mm coordination and roles of pelvic floor function in urination, defecation, sexual function, and postural control with deep core mm system.    Regional interdependent approaches will yield greater benefits in pt's POC.   Today's Tx include:  explaining the correlation with OSA and nocturia, provided information, discussed getting updated sleep study to rule in/out OSA because pt's medical records indicated OSA in 2015 and pt reported she was not aware.   Encouraged increasing water intake , deceasing juice intake , explained plan to address uneven shoulder and pelvic height to help with walking and then work towards leakage issues.  Pt's mobility must be addressed first  before addressing pelvic floor to prevent fall risk.   Anticipate by addressing realignment of spine/ pelvis at next session will help promote optimize IAP system for improved pelvic floor function, trunk stability, gait, balance, stabilization with mobility tasks.  Plan to address pelvic floor issues once pelvis and spine are  realigned to yield better outcomes.   Pt benefits from skilled PT.       OBJECTIVE IMPAIRMENTS decreased activity tolerance, decreased coordination, decreased endurance,  decreased mobility, difficulty walking, decreased ROM, decreased strength, decreased safety awareness, hypomobility, increased muscle spasms, impaired flexibility, improper body mechanics, postural dysfunction, and. scar restrictions   ACTIVITY LIMITATIONS  self-care,  home chores, sleep    PARTICIPATION LIMITATIONS:  community, ADLs  activities    PERSONAL FACTORS        are also affecting patient's functional outcome.    REHAB POTENTIAL: Good   CLINICAL DECISION MAKING: Evolving/moderate complexity   EVALUATION COMPLEXITY: Moderate    PATIENT EDUCATION:    Education details: Showed pt anatomy images. Explained muscles attachments/ connection, physiology of deep core system/ spinal- thoracic-pelvis-lower kinetic chain as they relate to pt's presentation, Sx, and past Hx. Explained what and how these areas of deficits need to be restored to balance and function    See Therapeutic activity / neuromuscular re-education section  Answered pt's questions.   Person educated: Patient Education method: Explanation, Demonstration, Tactile cues, Verbal cues, and Handouts Education comprehension: verbalized understanding, returned demonstration, verbal cues required, tactile cues required, and needs further education     PLAN: PT FREQUENCY: 1x/week   PT DURATION: 10 weeks   PLANNED INTERVENTIONS: Therapeutic exercises, Therapeutic activity, Neuromuscular re-education, Balance training, Gait training, Patient/Family education, Self Care, Joint mobilization, Spinal mobilization, Moist heat, Taping, and Manual therapy, dry needling.   PLAN FOR NEXT SESSION: See clinical impression for plan     GOALS: Goals reviewed with patient? Yes  SHORT TERM GOALS: Target date: 10/22/2023    Pt will demo IND with HEP                    Baseline: Not IND            Goal status: INITIAL   LONG TERM GOALS: Target date: 12/03/2023    1.Pt will demo proper deep core coordination without chest  breathing and optimal excursion of diaphragm/pelvic floor in order to promote spinal stability and pelvic floor function  Baseline: dyscoordination Goal status: INITIAL  2.  Pt will demo proper body mechanics in against gravity tasks and ADLs  work tasks, fitness  to minimize straining pelvic floor / back    Baseline: not IND, improper form that places strain on pelvic floor  Goal status: INITIAL    3. Pt will demo increased gait speed > 0.8  m/s with reciprocal gait pattern, longer stride length  in order to ambulate safely in community and return to fitness routine  Baseline: 0.51 m/s ( gait belt , SBA)  short strides, poor push up, decreased stance on L  Goal status: INITIAL    4. Pt will report  increase water intake from 0 fl oz to > 16  fl oz,   decrease juice  from 48 floz to 32 fl oz.    Baseline:  Daily fluid intake:  water  0__  fl oz, juice 48 floz Goal status: INITIAL      5. Pt will report BMs occurring daily   Less fecal leakage < 2 x week   Baseline:  BMs occur _1-3 every days  a week Fecal leakage occurs 3 x across one week.   Bowel movements occurs every 1-3 days.   Goal status: INITIAL   6. Pt will demo levelled pelvic girdle and shoulder height in order to progress to deep core strengthening HEP and  restore mobility at spine, pelvis, gait, posture minimize falls, and improve balance   Baseline: R shoulder, L iliac crest higher  Goal status: INITIAL   7.  Pt will report decreased diaper use 2-3 diapers during the day and < 2 at night or weaning to urinary pads and not using paper towels inside diapers for improved hygiene. Baseline: Pt uses 4-6 diapers in during day and 3 at night. Pt uses paper towels inside her diapers. She throws away 6 of the paper towels before the diaper gets wet ( late morning, afternoon, late afternoon, evening)  Goal status: INITIAL  8. Pt 's PCP will be contacted about updated sleep study to rule in/out OSA Baseline:  pt was  Dx with OSA in 2015 based on medical records  Goal status: INITIAL    [place] Andreas, PT 09/24/2023, 4:09 PM   [place] Andreas, PT 11/20/2023, 3:17 PM

## 2023-11-27 ENCOUNTER — Ambulatory Visit: Admitting: Physical Therapy

## 2023-12-04 ENCOUNTER — Encounter: Admitting: Physical Therapy

## 2023-12-05 ENCOUNTER — Ambulatory Visit: Admitting: Physical Therapy

## 2023-12-12 ENCOUNTER — Ambulatory Visit: Admitting: Physical Therapy

## 2023-12-18 ENCOUNTER — Ambulatory Visit: Admitting: Physical Therapy

## 2023-12-25 ENCOUNTER — Encounter: Admitting: Physical Therapy

## 2024-01-09 ENCOUNTER — Encounter: Admitting: Physical Therapy

## 2024-01-16 ENCOUNTER — Encounter: Admitting: Physical Therapy

## 2024-01-23 ENCOUNTER — Encounter: Admitting: Physical Therapy

## 2024-01-29 ENCOUNTER — Encounter: Admitting: Physical Therapy

## 2024-02-05 ENCOUNTER — Encounter: Admitting: Physical Therapy

## 2024-02-12 ENCOUNTER — Encounter: Admitting: Physical Therapy

## 2024-02-19 ENCOUNTER — Ambulatory Visit: Attending: Family Medicine | Admitting: Physical Therapy

## 2024-02-19 DIAGNOSIS — R2681 Unsteadiness on feet: Secondary | ICD-10-CM | POA: Diagnosis present

## 2024-02-19 DIAGNOSIS — R262 Difficulty in walking, not elsewhere classified: Secondary | ICD-10-CM | POA: Diagnosis present

## 2024-02-19 DIAGNOSIS — R2689 Other abnormalities of gait and mobility: Secondary | ICD-10-CM | POA: Diagnosis present

## 2024-02-19 DIAGNOSIS — M6281 Muscle weakness (generalized): Secondary | ICD-10-CM | POA: Diagnosis present

## 2024-02-19 DIAGNOSIS — R293 Abnormal posture: Secondary | ICD-10-CM | POA: Diagnosis present

## 2024-02-19 DIAGNOSIS — M533 Sacrococcygeal disorders, not elsewhere classified: Secondary | ICD-10-CM | POA: Diagnosis present

## 2024-02-19 NOTE — Therapy (Unsigned)
 OUTPATIENT PHYSICAL THERAPY TREATMENT  / RECERT /  Discharge Summary across 3 visits    Patient Name: Karen Frank MRN: [id number] DOB:02/16/1946, 78 y.o., female Today's Date: 02/19/2024   PT End of Session - 02/19/24 1515     Visit Number 3    Number of Visits 10    Date for PT Re-Evaluation 02/20/24    PT Start Time 1511    PT Stop Time 1545    PT Time Calculation (min) 34 min    Activity Tolerance Patient tolerated treatment well    Behavior During Therapy [facility] for tasks assessed/performed          Past Medical History:  Diagnosis Date   Essential hypertension    Migraines    Mitral valve prolapse    Ruptured disc, cervical    Past Surgical History:  Procedure Laterality Date   APPENDECTOMY     HAND SURGERY      Active Problems  Problem Noted Date Diagnosed Date  Essential hypertension 09/17/2022    Hyperlipidemia 09/17/2022    Controlled type 2 diabetes mellitus without complication, without long-term current use of insulin (CMS/HHS-HCC) 09/17/2022    Phlebitis 09/14/2014    History of Lyme disease 09/14/2014    Obstructive sleep apnea-hypopnea syndrome 04/15/2014    Dysphonia 11/02/2013      PCP: Cyrus Mayo, PA-C   REFERRING PROVIDER:  Cyrus Mayo, PA-C   REFERRING DIAG: Mixed Urinary Incontinence  Rationale for Evaluation and Treatment Rehabilitation  THERAPY DIAG:  Sacrococcygeal disorders, not elsewhere classified  Muscle weakness (generalized)  Abnormal posture  ONSET DATE: 1+year ago   SUBJECTIVE:                                                                                                                                                                                           SUBJECTIVE STATEMENT TODAY:   Pt reports urinating is an issue where she is not able to make it to the bathroom without leakage when she feels she needs to go. Pt also has leakage with getting up from sitting  Pt wets her pants and the floor. Pt wear  diapers.   Pt has to go to the bathroom every 20-25 minutes and then sometimes she goes every 10 minutes   Pt is not drinking that much water. Pt is not sure how much she is drinking but she is drinking.   Pt reports she had noticed she had difficulty with walking a month ago and would like to get PT to help with walking . Pt noticed she not able to get up and start walking. Pt noticed she has to  get herself ready to walk.  Pt has trimmed her toe nails.     SUBJECTIVE STATEMENT ON EVAL 4/ 8/ 25 :  1) urinary frequency :   a. Daytime: Pt does not have leakage during the daytime and makes it to the bathroom in time without leakage. During the day, her frequency is 4 x every 2 hours. Daily fluid intake:  I do not drink plain water ,  Pt mixes juice with water and drinks 48 fl oz per day.                                          b. Nightime: 6 x/ night .  Last night, pt had leakage as soon as she walked to from the bed to the toilet.    Pt had been Dx with OSA in 2015 based on medical records and pt reported she was not given a CPAP machine when she was Dx with OSA in 2015.   Pt uses 4-6 diapers in during day and 3 at night. Pt uses paper towels inside her diapers. She throws away 6 of the paper towels before the diaper gets wet ( late morning, afternoon, late afternoon, evening)   2) fecal leakage:  occurs 3 x across one week.   Bowel movements occurs every 1-3 days.   Does not strain with bowel movement . Stool consistency Type 4.     3) difficulty with walking: I can walk around my house a little but need the cane.  When she goes shopping, she uses her RW and then sits in the carts to do her shopping.    PERTINENT HISTORY:   Pt has had no children. Surgeries:  Appendectomy.     PAIN:  Are you having pain? No   PRECAUTIONS: None  WEIGHT BEARING RESTRICTIONS: No  FALLS:  Has patient fallen in last 6 months? No  LIVING ENVIRONMENT: Lives with: alone Lives in: house Stairs: no  STE, 1 floor house  Has following equipment at home: rolling walker in the community  , [facility] which she uses around the house   OCCUPATION: retired from [employer] operational officer   PLOF:  no problem with fecal and urinary leakage before   PATIENT GOALS: be able to walk again, less leakage    OBJECTIVE:   [facility] PT Assessment       Observation/Other Assessments   Skin Integrity trimmed toenails compared long, unclipped nails      Palpation   SI assessment  levelled pelvis and shoudler height with shoe lift in R shoe      Ambulation/Gait   Gait Comments 02/20/24   0.5 m/s with RW , gait belt donned , wearing shoe lift in R shoe   11/20/23    0.89 m/s ( gait belt donned, SBA ) , wearing shoe lift in R shoe   Eval 09/24/23     0.51 m/s ( gait belt , SBA) short strides, poor push up, decreased stance on L    Strength:  RLE 3+/5,  L 4-/5       [facility] Adult PT Treatment/Exercise       Self-Care   Other Self-Care Comments  explained d/c today to refer pt to gait training order because she is concerned about her increased difficulty with walking. Pt declined Home health PT despite difficulty with transportation.      Therapeutic Activites  Other Therapeutic Activities discussed tehcnique for sit to stand to minimzie leakage,  assessed walking speed with RW / gait belt donned,  discussed importance to track bladder chart for water intake and urge/ trips to thebathroom time      Neuro Re-ed    Neuro Re-ed Details  cued for sit to stand with exhalation to coactivate pelvic floor mm to minimize leakage             HOME EXERCISE PROGRAM: See pt instruction section    ASSESSMENT:  CLINICAL IMPRESSION:       With the past 3 visits pt attended since April.  Pt has met 3/8 goals and did not meet her other goals. Pt cancelled many appts with transportation issues as a primary issue and pt reported a fall that caused her to miss several visits.   Addressed pt 's report of stress incontinence with sit  to stand today. Cued pt for  sit to stand with exhalation to coactivate pelvic floor mm to minimize leakage . Discussed technique for sit to stand to minimzie leakage,    Pt has been compliant with increasing water intake but does not track the amount. Discussed importance to track bladder chart for water intake and urge/ trips to the bathroom time.     Improvements include levelled spine/ pelvis with R shoe lift.  Pt showed  reciporcal gait but minimal mobility at hip/ knee, ankle and short stride back in June.   However, gait speed relapsed on 02/20/24  0.5 m/s and with pt's report of walking difficulty which is why pt is d/c from Pelvic PT and referred for gait training. Pt is concerned about her decline in gait  02/20/24   0.5 m/s with RW , gait belt donned , wearing shoe lift in R shoe   11/20/23    0.89 m/s ( gait belt donned, SBA ) , wearing shoe lift in R shoe   Eval 09/24/23     0.51 m/s ( gait belt , SBA) short strides, poor push up, decreased stance on L   Explained d/c today to refer pt to gait training order because she is concerned about her increased difficulty with walking. Pt declined Home health PT despite difficulty with transportation.  Pt's functional mobility with ambulation limits impacts her ability to get to the bathroom when she has urge to go to urinate.   Plan to get referral for gait training from PCP.   Pt is getting d/c from Pelvic PT today to prioritize gait training to minimize falls and to help with pt's functional incontinence.        OBJECTIVE IMPAIRMENTS decreased activity tolerance, decreased coordination, decreased endurance, decreased mobility, difficulty walking, decreased ROM, decreased strength, decreased safety awareness, hypomobility, increased muscle spasms, impaired flexibility, improper body mechanics, postural dysfunction, and. scar restrictions   ACTIVITY LIMITATIONS  self-care,  home chores, sleep    PARTICIPATION LIMITATIONS:  community, ADLs   activities    PERSONAL FACTORS        are also affecting patient's functional outcome.    REHAB POTENTIAL: Good   CLINICAL DECISION MAKING: Evolving/moderate complexity   EVALUATION COMPLEXITY: Moderate    PATIENT EDUCATION:    Education details: Showed pt anatomy images. Explained muscles attachments/ connection, physiology of deep core system/ spinal- thoracic-pelvis-lower kinetic chain as they relate to pt's presentation, Sx, and past Hx. Explained what and how these areas of deficits need to be restored to balance and function    See Therapeutic  activity / neuromuscular re-education section  Answered pt's questions.   Person educated: Patient Education method: Explanation, Demonstration, Tactile cues, Verbal cues, and Handouts Education comprehension: verbalized understanding, returned demonstration, verbal cues required, tactile cues required, and needs further education     PLAN: PT FREQUENCY: 1x/week   PT DURATION: 10 weeks   PLANNED INTERVENTIONS: Therapeutic exercises, Therapeutic activity, Neuromuscular re-education, Balance training, Gait training, Patient/Family education, Self Care, Joint mobilization, Spinal mobilization, Moist heat, Taping, and Manual therapy, dry needling.   PLAN FOR NEXT SESSION: See clinical impression for plan     GOALS: Goals reviewed with patient? Yes  SHORT TERM GOALS: Target date: 10/22/2023    Pt will demo IND with HEP                    Baseline: Not IND            Goal status: INITIAL   LONG TERM GOALS: Target date: 02/20/24    1.Pt will demo proper deep core coordination without chest breathing and optimal excursion of diaphragm/pelvic floor in order to promote spinal stability and pelvic floor function  Baseline: dyscoordination Goal status: Not met   2.  Pt will demo proper body mechanics in against gravity tasks and ADLs  work tasks, fitness  to minimize straining pelvic floor / back    Baseline: not IND, improper form  that places strain on pelvic floor  Goal status:  MET ( sit to stand technique)     3. Pt will demo increased gait speed > 0.8  m/s with reciprocal gait pattern, longer stride length  in order to ambulate safely in community and return to fitness routine  Baseline: 0.51 m/s ( gait belt , SBA)  short strides, poor push up, decreased stance on L  Goal status:  MET  in June after shoe lift fitting in R shoe however, gait speed relapsed on 02/20/24  0.5 m/s and with pt's report of walking difficulty which is why pt is d/c from Pelvic PT and referred for gait training. Pt is concerned about her decline in gait  02/20/24   0.5 m/s with RW , gait belt donned , wearing shoe lift in R shoe   11/20/23    0.89 m/s ( gait belt donned, SBA ) , wearing shoe lift in R shoe   Eval 09/24/23     0.51 m/s ( gait belt , SBA) short strides, poor push up, decreased stance on L     4. Pt will report  increase water intake from 0 fl oz to > 16  fl oz,   decrease juice  from 48 floz to 32 fl oz.    Baseline:  Daily fluid intake:  water  0__  fl oz, juice 48 floz Goal status:  Partially met   ( 02/20/24: pt has been drinking more water throughout the day but did not track amount)     5. Pt will report BMs occurring daily   Less fecal leakage < 2 x week   Baseline:  BMs occur _1-3 every days  a week Fecal leakage occurs 3 x across one week.   Bowel movements occurs every 1-3 days.   Goal status: Not met    6. Pt will demo levelled pelvic girdle and shoulder height in order to progress to deep core strengthening HEP and restore mobility at spine, pelvis, gait, posture minimize falls, and improve balance   Baseline: R shoulder, L iliac crest higher  Goal  status: MET, shoe lift in R shoe    7.  Pt will report decreased diaper use 2-3 diapers during the day and < 2 at night or weaning to urinary pads and not using paper towels inside diapers for improved hygiene. Baseline: Pt uses 4-6 diapers in during day and 3 at  night. Pt uses paper towels inside her diapers. She throws away 6 of the paper towels before the diaper gets wet ( late morning, afternoon, late afternoon, evening)  Goal status:  Not met   8. Pt 's PCP will be contacted about updated sleep study to rule in/out OSA Baseline:  pt was Dx with OSA in 2015 based on medical records  Goal status: Not Met because pt declined        Pia Lupe Plump, PT 02/19/2024, 3:16 PM

## 2024-02-19 NOTE — Patient Instructions (Signed)
 ?  Proper body mechanics with getting out of a chair to decrease strain  ?on back &pelvic floor  ? ?Avoid holding your breath when ?Getting out of the chair: ? ?Scoot to front part of chair chair ?Heels behind knees, feet are hip width apart, nose over toes  ?Inhale like you are smelling roses ?Exhale to stand  ? ? ?

## 2024-03-12 ENCOUNTER — Encounter: Admitting: Physical Therapy

## 2024-04-07 ENCOUNTER — Ambulatory Visit: Admitting: Physical Therapy

## 2024-04-14 ENCOUNTER — Encounter: Admitting: Physical Therapy

## 2024-04-21 ENCOUNTER — Ambulatory Visit: Admitting: Physical Therapy

## 2024-04-28 ENCOUNTER — Ambulatory Visit: Admitting: Physical Therapy

## 2024-05-05 ENCOUNTER — Ambulatory Visit: Admitting: Physical Therapy

## 2024-05-07 ENCOUNTER — Ambulatory Visit: Admitting: Physical Therapy

## 2024-05-12 ENCOUNTER — Ambulatory Visit: Admitting: Physical Therapy

## 2024-05-19 ENCOUNTER — Ambulatory Visit

## 2024-05-26 ENCOUNTER — Ambulatory Visit: Admitting: Physical Therapy

## 2024-06-01 ENCOUNTER — Ambulatory Visit: Admitting: Physical Therapy

## 2024-06-08 ENCOUNTER — Ambulatory Visit

## 2024-06-16 ENCOUNTER — Ambulatory Visit: Admitting: Physical Therapy

## 2024-06-23 ENCOUNTER — Ambulatory Visit: Admitting: Physical Therapy

## 2024-06-24 ENCOUNTER — Ambulatory Visit: Admitting: Physical Therapy

## 2024-06-29 ENCOUNTER — Ambulatory Visit

## 2024-07-06 ENCOUNTER — Ambulatory Visit

## 2024-07-13 ENCOUNTER — Ambulatory Visit

## 2024-07-20 ENCOUNTER — Ambulatory Visit

## 2024-07-27 ENCOUNTER — Ambulatory Visit

## 2024-08-03 ENCOUNTER — Ambulatory Visit

## 2024-08-10 ENCOUNTER — Ambulatory Visit

## 2024-08-17 ENCOUNTER — Ambulatory Visit: Admitting: Physical Therapy

## 2024-08-24 ENCOUNTER — Ambulatory Visit: Admitting: Physical Therapy
